# Patient Record
Sex: Male | Born: 1965
Health system: Southern US, Community
[De-identification: ages and names within clinical notes are randomized; demographics above are authoritative.]

## PROBLEM LIST (undated history)

## (undated) DIAGNOSIS — S21339A Puncture wound without foreign body of unspecified front wall of thorax with penetration into thoracic cavity, initial encounter: Secondary | ICD-10-CM

## (undated) DIAGNOSIS — I10 Essential (primary) hypertension: Secondary | ICD-10-CM

## (undated) DIAGNOSIS — E785 Hyperlipidemia, unspecified: Secondary | ICD-10-CM

## (undated) DIAGNOSIS — W3400XA Accidental discharge from unspecified firearms or gun, initial encounter: Secondary | ICD-10-CM

## (undated) HISTORY — DX: Essential (primary) hypertension: I10

## (undated) HISTORY — PX: CHOLECYSTECTOMY: SHX55

## (undated) HISTORY — PX: OTHER SURGICAL HISTORY: SHX169

## (undated) HISTORY — DX: Hyperlipidemia, unspecified: E78.5

---

## 1999-05-01 ENCOUNTER — Emergency Department (HOSPITAL_COMMUNITY): Admission: EM | Admit: 1999-05-01 | Discharge: 1999-05-01 | Payer: Self-pay | Admitting: Emergency Medicine

## 2000-05-09 ENCOUNTER — Emergency Department (HOSPITAL_COMMUNITY): Admission: EM | Admit: 2000-05-09 | Discharge: 2000-05-09 | Payer: Self-pay | Admitting: Internal Medicine

## 2000-06-02 ENCOUNTER — Encounter: Payer: Self-pay | Admitting: Surgery

## 2000-06-04 ENCOUNTER — Observation Stay (HOSPITAL_COMMUNITY): Admission: RE | Admit: 2000-06-04 | Discharge: 2000-06-05 | Payer: Self-pay | Admitting: Surgery

## 2000-06-04 ENCOUNTER — Encounter (INDEPENDENT_AMBULATORY_CARE_PROVIDER_SITE_OTHER): Payer: Self-pay | Admitting: Specialist

## 2000-08-28 ENCOUNTER — Encounter: Payer: Self-pay | Admitting: Emergency Medicine

## 2000-08-28 ENCOUNTER — Emergency Department (HOSPITAL_COMMUNITY): Admission: EM | Admit: 2000-08-28 | Discharge: 2000-08-28 | Payer: Self-pay | Admitting: Emergency Medicine

## 2000-09-04 ENCOUNTER — Ambulatory Visit (HOSPITAL_COMMUNITY): Admission: RE | Admit: 2000-09-04 | Discharge: 2000-09-05 | Payer: Self-pay | Admitting: Surgery

## 2002-08-19 ENCOUNTER — Inpatient Hospital Stay (HOSPITAL_COMMUNITY): Admission: EM | Admit: 2002-08-19 | Discharge: 2002-08-21 | Payer: Self-pay | Admitting: Emergency Medicine

## 2002-08-19 ENCOUNTER — Encounter: Payer: Self-pay | Admitting: Emergency Medicine

## 2002-10-29 ENCOUNTER — Emergency Department (HOSPITAL_COMMUNITY): Admission: EM | Admit: 2002-10-29 | Discharge: 2002-10-30 | Payer: Self-pay | Admitting: *Deleted

## 2002-10-29 ENCOUNTER — Encounter: Payer: Self-pay | Admitting: Emergency Medicine

## 2003-01-13 ENCOUNTER — Encounter: Payer: Self-pay | Admitting: Emergency Medicine

## 2003-01-13 ENCOUNTER — Emergency Department (HOSPITAL_COMMUNITY): Admission: EM | Admit: 2003-01-13 | Discharge: 2003-01-13 | Payer: Self-pay | Admitting: Emergency Medicine

## 2004-07-23 ENCOUNTER — Emergency Department (HOSPITAL_COMMUNITY): Admission: EM | Admit: 2004-07-23 | Discharge: 2004-07-23 | Payer: Self-pay | Admitting: *Deleted

## 2006-09-29 ENCOUNTER — Emergency Department (HOSPITAL_COMMUNITY): Admission: EM | Admit: 2006-09-29 | Discharge: 2006-09-29 | Payer: Self-pay | Admitting: Family Medicine

## 2006-12-15 ENCOUNTER — Emergency Department (HOSPITAL_COMMUNITY): Admission: EM | Admit: 2006-12-15 | Discharge: 2006-12-15 | Payer: Self-pay | Admitting: Emergency Medicine

## 2007-08-17 ENCOUNTER — Emergency Department (HOSPITAL_COMMUNITY): Admission: EM | Admit: 2007-08-17 | Discharge: 2007-08-17 | Payer: Self-pay | Admitting: Emergency Medicine
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
## 2010-09-13 NOTE — Op Note (Signed)
[place]  Patient:    Billy Hogan, Billy Hogan                         MRN: [id number] Proc. Date: 09/04/00 Adm. Date:  [id number] Disc. Date: [id number] Attending:  Abigail Miyamoto A                           Operative Report  PREOPERATIVE DIAGNOSIS:  Ventral incisional hernia.  POSTOPERATIVE DIAGNOSIS:  Ventral incisional hernia.  PROCEDURE:  Open ventral hernia repair with mesh.  SURGEON:  Abigail Miyamoto, M.D.  ANESTHESIA:  General endotracheal anesthesia.  ESTIMATED BLOOD LOSS:  Minimal.  DESCRIPTION OF PROCEDURE:  The patient was brought to the operating room and identified as Billy Hogan.  He was placed supine on the operating table and general anesthesia was induced.  His abdomen was the prepped and draped in the usual sterile fashion.  Using a #10 blade, a small midline incision was made in the upper abdomen through the patients previous incision.  The incision was carried down to the hernia sac which is easily identified with the electrocautery.  The hernia sac was then dissected off circumferentially.  The sac was found to contain omental fat with no evidence of incarcerated bowel. The omentum from the hernia was completely resected with the electrocautery. The rest of the contents were then pushed back into the hernia defect.  Two smaller fascial defects were identified just superior and inferior to this larger defect, and were all well connected by opening the fascia.  Adhesions were then taken down circumferentially from the fascial edges with cautery and the Metzenbaum scissors.  The skin was then mobilized circumferentially, freeing the tissue up from the fascia.  The fascial defect was then closed with a running #1 PDS suture.  A piece of atrial mesh was brought on to the field and fashioned to a 6 x 12 piece.  The mesh was then sewn in an onlay fashion to the fascia with interrupted 0 Novofil pop-off sutures.  A separate skin  incision was then created and a 19-French Blake drain was placed into the wound.  The wound was then copiously irrigated with normal saline.  The subcutaneous layer was then closed with interrupted 3-0 Vicryl sutures and the skin was closed with skin staples.  The drain was then placed with a single Novofil suture.  The patient tolerated the procedure well.  All sponge, needle, and instrument counts were correct at the end of the procedure.  The patient was then extubated in the operating room and taken in stable condition to the recovery room. DD:  09/04/00 TD:  09/06/00 Job: [id number]

## 2010-09-13 NOTE — Op Note (Signed)
[facility]  Patient:    Billy Hogan, Billy Hogan                         MRN: [id number] Proc. Date: 06/04/00 Adm. Date:  [id number] Disc. Date: [id number] Attending:  Abigail Miyamoto A                           Operative Report  PREOPERATIVE DIAGNOSIS:  Symptomatic cholelithiasis.  POSTOPERATIVE DIAGNOSIS:  Symptomatic cholelithiasis.  OPERATION:  Laparoscopic cholecystectomy.  SURGEON:  Abigail Miyamoto, M.D.  ASSISTANT:  Chevis Pretty, M.D.  ANESTHESIA:  General endotracheal anesthesia and 0.25% Marcaine plain.  ESTIMATED BLOOD LOSS:   Minimal.  DESCRIPTION OF PROCEDURE:  The patient was brought to the operating room and identified as Billy Hogan.  He was placed supine on the operating room table. General anesthesia was induced.  His abdomen was then prepped and draped in the usual sterile fashion.  Using a 15 blade, a small transverse incision was made through the umbilicus.  Incision was carried down through the fascia. The fascia was then opened with the scalpel.  Hemostat was then used to pass it to the peritoneal cavity.  A finger was then inserted through the incision. The patient was indeed found to have several small fascial defects in the midline incision through a previous laparotomy.  There was no bowel involved with these.  The _____ was then placed through the opening and insufflation of the abdomen was begun.  Next, a 11 mm port was placed in the patients epigastrium, and two 5 mm ports were placed to the patients right flank under direct vision.  The gallbladder was then identified.  It was found to be quite tense and distended.  Therefore, needle aspirator was inserted and bile was aspirated in order to facilitate grasping the gallbladder.  The gallbladder was then grasped and retracted above the liver bed.  Dissection was then carried out at the hilum of the gallbladder.  The cystic duct was identified and clipped three times proximally and  once distally and transected with the scissors.  The cystic artery was then identified and clipped several times proximally and once distally and transected as well.  The gallbladder was then dissected free from the liver bed with electrocautery.  Several holes were made in the thick wall of the gallbladder and some stones spilled out of this and were easily grasped with the stones being prepared.  Once the gallbladder was completed removed, it was placed into and Endosac and removed through the umbilicus.  The abdomen was copiously irrigated with normal saline saline.  Again, hemostasis appeared to be achieved and all stones were removed.  Again, the midline fascia was identified and several small hernia defects were apparent.  At this point, decision was made to remove all ports and deflate the abdomen which was done under direct vision.  The 0 Vicryl at the umbilicus was then tied in place.  A separate #1 Novofil was also placed at the fascial closure.  All skin incisions were then anesthetized with 0.25% Marcaine.  The umbilical incision was then closed with running 4-0 Monocryl. The other incisions were then closed with Dermabond.  The patient tolerated the procedure well.  All sponge, needle and instrument counts were correct at the end of the procedure.  The patient was extubated in the operating room and taken in stable condition to the recovery room. DD:  06/04/00 TD:  06/05/00 Job: [id number]

## 2011-02-13 LAB — POCT URINALYSIS DIP (DEVICE)
Bilirubin Urine: NEGATIVE
Glucose, UA: NEGATIVE
Ketones, ur: NEGATIVE
Nitrite: NEGATIVE
Operator id: 235561
Protein, ur: 30 — AB
Specific Gravity, Urine: 1.01
Urobilinogen, UA: 1
pH: 6

## 2011-02-13 LAB — I-STAT 8, (EC8 V) (CONVERTED LAB)
Acid-Base Excess: 2
BUN: 9
Bicarbonate: 28.9 — ABNORMAL HIGH
Chloride: 101
Glucose, Bld: 103 — ABNORMAL HIGH
HCT: 47
Hemoglobin: 16
Operator id: 235561
Potassium: 3.3 — ABNORMAL LOW
Sodium: 138
TCO2: 30
pCO2, Ven: 51.6 — ABNORMAL HIGH
pH, Ven: 7.356 — ABNORMAL HIGH

## 2011-02-13 LAB — DIFFERENTIAL
Basophils Absolute: 0
Basophils Relative: 0
Eosinophils Absolute: 0
Eosinophils Relative: 0
Lymphocytes Relative: 18
Lymphs Abs: 1
Monocytes Absolute: 0.3
Monocytes Relative: 5
Neutro Abs: 4.2
Neutrophils Relative %: 76

## 2011-02-13 LAB — CBC
HCT: 43.3
Hemoglobin: 14.1
MCHC: 32.5
MCV: 84.3
Platelets: 205
RBC: 5.14
RDW: 14.2 — ABNORMAL HIGH
WBC: 5.5

## 2011-02-13 LAB — POCT I-STAT CREATININE
Creatinine, Ser: 1.4
Operator id: 235561

## 2013-02-22 ENCOUNTER — Encounter (INDEPENDENT_AMBULATORY_CARE_PROVIDER_SITE_OTHER): Payer: Self-pay | Admitting: Surgery

## 2013-03-14 ENCOUNTER — Encounter (INDEPENDENT_AMBULATORY_CARE_PROVIDER_SITE_OTHER): Payer: Self-pay | Admitting: Surgery

## 2013-03-14 ENCOUNTER — Ambulatory Visit (INDEPENDENT_AMBULATORY_CARE_PROVIDER_SITE_OTHER): Payer: 59 | Admitting: Surgery

## 2013-03-14 VITALS — BP 154/70 | HR 88 | Resp 16 | Ht 71.0 in | Wt 298.0 lb

## 2013-03-14 DIAGNOSIS — R229 Localized swelling, mass and lump, unspecified: Secondary | ICD-10-CM

## 2013-03-14 DIAGNOSIS — R222 Localized swelling, mass and lump, trunk: Secondary | ICD-10-CM | POA: Insufficient documentation

## 2013-03-14 NOTE — Progress Notes (Signed)
Patient ID: Billy Hogan, male   DOB: 08-29-1965, 47 y.o.   MRN: [id number]  Chief Complaint  Patient presents with  . New Evaluation    eval rt mid back SUBQ nudule    HPI Billy Hogan is a 47 y.o. male.   HPI This is a pleasant gentleman referred by Dr. Kirby Funk for evaluation of a mass on his back. It has been present for approximately one year it is getting larger and causing him to have pain. He denies any trauma to the area. He is otherwise without complaints. The pain is described as a dull ache. He does not refer anywhere else Past Medical History  Diagnosis Date  . Hyperlipidemia   . Hypertension     Past Surgical History  Procedure Laterality Date  . Cholecystectomy      History reviewed. No pertinent family history.  Social History History  Substance Use Topics  . Smoking status: Former Games developer  . Smokeless tobacco: Never Used  . Alcohol Use: Yes     Comment: occ    No Known Allergies  No current outpatient prescriptions on file.   No current facility-administered medications for this visit.    Review of Systems Review of Systems  Constitutional: Negative for fever, chills and unexpected weight change.  HENT: Negative for congestion, hearing loss, sore throat, trouble swallowing and voice change.   Eyes: Negative for visual disturbance.  Respiratory: Negative for cough and wheezing.   Cardiovascular: Negative for chest pain, palpitations and leg swelling.  Gastrointestinal: Negative for nausea, vomiting, abdominal pain, diarrhea, constipation, blood in stool, abdominal distention, anal bleeding and rectal pain.  Genitourinary: Negative for hematuria and difficulty urinating.  Musculoskeletal: Negative for arthralgias.  Skin: Negative for rash and wound.  Neurological: Negative for seizures, syncope, weakness and headaches.  Hematological: Negative for adenopathy. Does not bruise/bleed easily.  Psychiatric/Behavioral: Negative for confusion.     Blood pressure 154/70, pulse 88, resp. rate 16, height 5\' 11"  (1.803 m), weight 298 lb (135.172 kg).  Physical Exam Physical Exam  Constitutional: He is oriented to person, place, and time. He appears well-developed and well-nourished. No distress.  HENT:  Head: Normocephalic and atraumatic.  Right Ear: External ear normal.  Left Ear: External ear normal.  Nose: Nose normal.  Mouth/Throat: Oropharynx is clear and moist. No oropharyngeal exudate.  Eyes: Conjunctivae are normal. Pupils are equal, round, and reactive to light. Right eye exhibits no discharge. Left eye exhibits no discharge. No scleral icterus.  Neck: Normal range of motion. Neck supple. No tracheal deviation present. No thyromegaly present.  Cardiovascular: Normal rate, regular rhythm, normal heart sounds and intact distal pulses.   No murmur heard. Pulmonary/Chest: Effort normal and breath sounds normal. No respiratory distress. He has no wheezes.  Abdominal: Bowel sounds are normal. He exhibits no distension. There is no tenderness. There is no rebound.  Musculoskeletal: Normal range of motion. He exhibits no edema and no tenderness.  Lymphadenopathy:    He has no cervical adenopathy.  Neurological: He is alert and oriented to person, place, and time.  Skin: Skin is warm and dry. No rash noted. He is not diaphoretic. No erythema.  There is a hard, firm, slightly mobile 3 cm mass on the patient's right paraspinal area at the mid back. It is nontender and there are no skin changes.    Data Reviewed I have reviewed the notes from his primary care physician  Assessment    3 centimeter mass on the back  Plan    Because of his discomfort, increase in size of the mass, and firmness, removal is recommended. It is quite hard and does not  Feel consistent with a lipoma or sebaceous cyst. I believe removal is forwarded for histologic evaluation to rule out malignancy. I discussed the risk of surgery which includes but  is not limited to bleeding, infection, need for further surgery, recurrence, etc. He understands and wishes to proceed.        Shruthi Northrup A 03/14/2013, 3:38 PM

## 2014-04-05 ENCOUNTER — Other Ambulatory Visit: Payer: Self-pay | Admitting: Internal Medicine

## 2014-04-05 DIAGNOSIS — M5416 Radiculopathy, lumbar region: Secondary | ICD-10-CM

## 2014-04-06 ENCOUNTER — Ambulatory Visit: Payer: Self-pay

## 2014-04-06 ENCOUNTER — Encounter: Payer: Self-pay | Admitting: Podiatry

## 2014-04-06 ENCOUNTER — Ambulatory Visit (INDEPENDENT_AMBULATORY_CARE_PROVIDER_SITE_OTHER): Payer: Self-pay | Admitting: Podiatry

## 2014-04-06 VITALS — BP 109/73 | HR 81 | Resp 16

## 2014-04-06 DIAGNOSIS — L600 Ingrowing nail: Secondary | ICD-10-CM

## 2014-04-06 DIAGNOSIS — M2042 Other hammer toe(s) (acquired), left foot: Secondary | ICD-10-CM

## 2014-04-06 MED ORDER — HYDROCODONE-ACETAMINOPHEN 10-325 MG PO TABS: 1.0000 | ORAL_TABLET | Freq: Three times a day (TID) | ORAL | Status: DC | PRN
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
## 2014-04-06 NOTE — Progress Notes (Signed)
Subjective:     Patient ID: Billy Hogan, male   DOB: 10/26/1965, 48 y.o.   MRN: [id number]  HPI patient has inflammation of his forefoot left and also complains of a damaged second nail left it's painful when pressed and very difficult for him to wear shoe gear with or to trim   Review of Systems  All other systems reviewed and are negative.      Objective:   Physical Exam  Constitutional: He is oriented to person, place, and time.  Cardiovascular: Intact distal pulses.   Musculoskeletal: Normal range of motion.  Neurological: He is oriented to person, place, and time.  Skin: Skin is warm.  Nursing note and vitals reviewed.  neurovascular status intact muscle strength adequate with range of motion within normal limits. Patient is noted to have back pain and he is post of an MRI tomorrow and at this time isn't quite a bit of pain. He is noted to have a severely damaged second nail left that's thick and abnormal in appearance and painful when pressed from a dorsal direction and also has mild forefoot discomfort     Assessment:     Damaged second nail left that's painful and has been this way for a number of years and mild forefoot capsulitis    Plan:     H&P and x-rays reviewed. Discussed the nail and I do think it would be best long-term removed and I explained the procedure. Patient wants surgery and I explained risk and today I infiltrated 60 mg Xylocaine Marcaine mixture removed the second nail exposed matrix and applied phenol 5 applications 30 seconds followed by alcohol lavaged and sterile dressing. They've instructions on soaks and reappoint

## 2014-04-06 NOTE — Progress Notes (Signed)
   Subjective:    Patient ID: Billy Hogan, male    DOB: 10/20/1965, 48 y.o.   MRN: [id number]  HPI Comments: "I have so much pain in this toe"  Patient c/o aching 2nd toe left for few months. He dropped a TV down on it months ago. The nail is thick and curling under. Shoes uncomfortable.   Toe Pain       Review of Systems  Musculoskeletal: Positive for back pain and gait problem.  All other systems reviewed and are negative.      Objective:   Physical Exam        Assessment & Plan:

## 2014-04-07 ENCOUNTER — Ambulatory Visit
Admission: RE | Admit: 2014-04-07 | Discharge: 2014-04-07 | Disposition: A | Discharge status: Home / Self Care | Payer: 59 | Source: Ambulatory Visit | Attending: Internal Medicine | Admitting: Internal Medicine

## 2014-04-07 DIAGNOSIS — M5416 Radiculopathy, lumbar region: Secondary | ICD-10-CM

## 2014-04-30 ENCOUNTER — Emergency Department (HOSPITAL_COMMUNITY)
Admission: EM | Admit: 2014-04-30 | Discharge: 2014-04-30 | Disposition: A | Discharge status: Home / Self Care | Payer: 59 | Attending: Emergency Medicine | Admitting: Emergency Medicine

## 2014-04-30 ENCOUNTER — Encounter (HOSPITAL_COMMUNITY): Payer: Self-pay | Admitting: Physical Medicine and Rehabilitation

## 2014-04-30 DIAGNOSIS — Z720 Tobacco use: Secondary | ICD-10-CM | POA: Insufficient documentation

## 2014-04-30 DIAGNOSIS — Z8639 Personal history of other endocrine, nutritional and metabolic disease: Secondary | ICD-10-CM | POA: Diagnosis not present

## 2014-04-30 DIAGNOSIS — H6091 Unspecified otitis externa, right ear: Secondary | ICD-10-CM

## 2014-04-30 DIAGNOSIS — H9201 Otalgia, right ear: Secondary | ICD-10-CM | POA: Diagnosis present

## 2014-04-30 DIAGNOSIS — I10 Essential (primary) hypertension: Secondary | ICD-10-CM | POA: Insufficient documentation

## 2014-04-30 MED ORDER — ANTIPYRINE-BENZOCAINE 5.4-1.4 % OT SOLN: 3.0000 [drp] | OTIC | Status: DC | PRN

## 2014-04-30 MED ORDER — CIPROFLOXACIN-DEXAMETHASONE 0.3-0.1 % OT SUSP
4.0000 [drp] | Freq: Two times a day (BID) | OTIC | Status: DC
  Administered 2014-04-30: 4 [drp] via OTIC
  Filled 2014-04-30: qty 7.5

## 2014-04-30 NOTE — ED Notes (Signed)
Pt presents to department for evaluation of R ear pain. Onset Saturday morning. States he put "sweet oil" inside of ear, no relief. Pt is alert and oriented x4. NAD.

## 2014-04-30 NOTE — ED Notes (Signed)
Pt states R ear pain, onset Saturday. Tenderness noted to R ear, yellow/bloody drainage noted. 8/10 pain at present, states muffled sounds to R ear.

## 2014-04-30 NOTE — ED Provider Notes (Signed)
CSN: [id number]     Arrival date & time 04/30/14  0800 History   First MD Initiated Contact with Patient 04/30/14 0805     Chief Complaint  Patient presents with  . Otalgia     (Consider location/radiation/quality/duration/timing/severity/associated sxs/prior Treatment) HPI   PCP: Lillia Mountain, MD  Darla Lesches is a 49 y.o.male with a significant PMH of hyperlipidemia and hypertension presents to the ER with complaints of RIGHT ear pain. He reports onset started on Saturday. He has been having tenderness and pain to that side. He has also noticed yellow drainage from the ear and a little bit of blood. He describes the pain as 7/10. He denies having fevers, nausea, vomiting, or diarrhea. Denies being exposed to sick contacts or wet for prolonged periods of time. Over-all he is well appearing.   Blood pressure 143/93, pulse 108, temperature 99 F (37.2 C), temperature source Oral, resp. rate 18, height  (1.803 m), weight 310 lb (140.615 kg), SpO2 98 %.  Past Medical History  Diagnosis Date  . Hyperlipidemia   . Hypertension    Past Surgical History  Procedure Laterality Date  . Cholecystectomy     History reviewed. No pertinent family history. History  Substance Use Topics  . Smoking status: Current Every Day Smoker    Types: Cigarettes  . Smokeless tobacco: Never Used  . Alcohol Use: Yes     Comment: occ    Review of Systems  10 Systems reviewed and are negative for acute change except as noted in the HPI.     Allergies  Review of patient's allergies indicates no known allergies.  Home Medications   Prior to Admission medications   Medication Sig Start Date End Date Taking? Authorizing Provider  antipyrine-benzocaine Lyla Son) otic solution Place 3-4 drops into the right ear every 2 (two) hours as needed for ear pain. 04/30/14   Chancey Ringel Irine Seal, PA-C  HYDROcodone-acetaminophen (NORCO) 10-325 MG per tablet Take 1 tablet by mouth every 8 (eight) hours as  needed. 04/06/14   Lenn Sink, DPM  traMADol (ULTRAM) 50 MG tablet Take by mouth every 6 (six) hours as needed.    Historical Provider, MD   BP 138/83 mmHg  Pulse 100  Temp(Src) 99 F (37.2 C) (Oral)  Resp 18  Ht  (1.803 m)  Wt 310 lb (140.615 kg)  BMI 43.26 kg/m2  SpO2 99% Physical Exam  Constitutional: He appears well-developed and well-nourished. No distress.  HENT:  Head: Normocephalic and atraumatic.  Right Ear: There is drainage, swelling and tenderness. Decreased hearing is noted.  Left Ear: Tympanic membrane and ear canal normal.  Nose: Nose normal.  Mouth/Throat: Uvula is midline and oropharynx is clear and moist.  Eyes: Pupils are equal, round, and reactive to light.  Neck: Normal range of motion. Neck supple. No spinous process tenderness and no muscular tenderness present.  Cardiovascular: Normal rate and regular rhythm.   Pulmonary/Chest: Effort normal.  Abdominal: Soft. Bowel sounds are normal. There is no tenderness. There is no rigidity and no guarding.  Neurological: He is alert.  Skin: Skin is warm and dry.  Nursing note and vitals reviewed.   ED Course  Procedures (including critical care time) Labs Review Labs Reviewed - No data to display  Imaging Review No results found.   EKG Interpretation None      MDM   Final diagnoses:  Otitis externa, right    Medications - No data to display antipyrine-benzocaine (AURALGAN) otic solution Place  3-4 drops into the right ear every 2 (two) hours as needed for ear pain. 10 mL Dorthula Matas, PA-C   Pt is well appearing, no systemic symptoms or fevers.  49 y.o.Harle Stanford Zeisler's evaluation in the Emergency Department is complete. It has been determined that no acute conditions requiring further emergency intervention are present at this time. The patient/guardian have been advised of the diagnosis and plan. We have discussed signs and symptoms that warrant return to the ED, such as changes or  worsening in symptoms.  Vital signs are stable at discharge. Filed Vitals:   04/30/14 0901  BP: 138/83  Pulse: 100  Temp:   Resp: 18    Patient/guardian has voiced understanding and agreed to follow-up with the PCP or specialist.      Dorthula Matas, PA-C 04/30/14 1543  Rolland Porter, MD 05/07/14 [id number]

## 2014-04-30 NOTE — Discharge Instructions (Signed)

## 2014-07-10 ENCOUNTER — Emergency Department (HOSPITAL_COMMUNITY)
Admission: EM | Admit: 2014-07-10 | Discharge: 2014-07-11 | Disposition: A | Discharge status: Home / Self Care | Payer: 59 | Attending: Emergency Medicine | Admitting: Emergency Medicine

## 2014-07-10 ENCOUNTER — Encounter (HOSPITAL_COMMUNITY): Payer: Self-pay | Admitting: Emergency Medicine

## 2014-07-10 ENCOUNTER — Emergency Department (HOSPITAL_COMMUNITY): Payer: 59

## 2014-07-10 DIAGNOSIS — E785 Hyperlipidemia, unspecified: Secondary | ICD-10-CM | POA: Insufficient documentation

## 2014-07-10 DIAGNOSIS — Z87828 Personal history of other (healed) physical injury and trauma: Secondary | ICD-10-CM | POA: Insufficient documentation

## 2014-07-10 DIAGNOSIS — I10 Essential (primary) hypertension: Secondary | ICD-10-CM | POA: Diagnosis not present

## 2014-07-10 DIAGNOSIS — Z720 Tobacco use: Secondary | ICD-10-CM | POA: Diagnosis not present

## 2014-07-10 DIAGNOSIS — R079 Chest pain, unspecified: Secondary | ICD-10-CM | POA: Insufficient documentation

## 2014-07-10 HISTORY — DX: Accidental discharge from unspecified firearms or gun, initial encounter: W34.00XA

## 2014-07-10 HISTORY — DX: Puncture wound without foreign body of unspecified front wall of thorax with penetration into thoracic cavity, initial encounter: S21.339A

## 2014-07-10 LAB — BASIC METABOLIC PANEL
Anion gap: 6 (ref 5–15)
BUN: 10 mg/dL (ref 6–23)
CALCIUM: 9.4 mg/dL (ref 8.4–10.5)
CO2: 27 mmol/L (ref 19–32)
Chloride: 107 mmol/L (ref 96–112)
Creatinine, Ser: 1.18 mg/dL (ref 0.50–1.35)
GFR calc Af Amer: 83 mL/min — ABNORMAL LOW (ref >90)
GFR, EST NON AFRICAN AMERICAN: 72 mL/min — AB (ref >90)
Glucose, Bld: 109 mg/dL — ABNORMAL HIGH (ref 70–99)
Potassium: 3.8 mmol/L (ref 3.5–5.1)
SODIUM: 140 mmol/L (ref 135–145)

## 2014-07-10 LAB — CBC
HCT: 45 % (ref 39.0–52.0)
HEMOGLOBIN: 13.8 g/dL (ref 13.0–17.0)
MCH: 27.1 pg (ref 26.0–34.0)
MCHC: 30.7 g/dL (ref 30.0–36.0)
MCV: 88.2 fL (ref 78.0–100.0)
PLATELETS: 223 10*3/uL (ref 150–400)
RBC: 5.1 MIL/uL (ref 4.22–5.81)
RDW: 14 % (ref 11.5–15.5)
WBC: 6.7 10*3/uL (ref 4.0–10.5)

## 2014-07-10 LAB — I-STAT TROPONIN, ED: TROPONIN I, POC: 0 ng/mL (ref 0.00–0.08)

## 2014-07-10 NOTE — ED Notes (Signed)
Pt states that he started having L chest pain yesterday and L arm numbness. No hx of such. Alert and oriented.

## 2014-07-11 ENCOUNTER — Encounter (HOSPITAL_COMMUNITY): Payer: Self-pay

## 2014-07-11 ENCOUNTER — Emergency Department (HOSPITAL_COMMUNITY): Payer: 59

## 2014-07-11 LAB — I-STAT TROPONIN, ED: TROPONIN I, POC: 0 ng/mL (ref 0.00–0.08)

## 2014-07-11 MED ORDER — SODIUM CHLORIDE 0.9 % IV BOLUS (SEPSIS)
1000.0000 mL | Freq: Once | INTRAVENOUS | Status: AC
  Administered 2014-07-11: 1000 mL via INTRAVENOUS

## 2014-07-11 MED ORDER — IOHEXOL 350 MG/ML SOLN
100.0000 mL | Freq: Once | INTRAVENOUS | Status: AC | PRN
  Administered 2014-07-11: 100 mL via INTRAVENOUS

## 2014-07-11 MED ORDER — NITROGLYCERIN 0.4 MG SL SUBL
0.4000 mg | SUBLINGUAL_TABLET | SUBLINGUAL | Status: DC | PRN
  Administered 2014-07-11 (×2): 0.4 mg via SUBLINGUAL
  Filled 2014-07-11 (×2): qty 1
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
## 2014-07-11 NOTE — Discharge Instructions (Signed)
Chest Pain (Nonspecific) Mr. Billy Hogan, your CT scan did not show any tear in your arteries coming from your heart.  Your EKG and blood work were normal. Your chest pain may still be due to injury to your heart, you need to follow-up with your primary care physician within 3 days for continued management. Please also follow up for your high blood pressure.  If symptoms worsen come back to the emergency department immediately. Thank you. It is often hard to give a diagnosis for the cause of chest pain. There is always a chance that your pain could be related to something serious, such as a heart attack or a blood clot in the lungs. You need to follow up with your doctor. HOME CARE  If antibiotic medicine was given, take it as directed by your doctor. Finish the medicine even if you start to feel better.  For the next few days, avoid activities that bring on chest pain. Continue physical activities as told by your doctor.  Do not use any tobacco products. This includes cigarettes, chewing tobacco, and e-cigarettes.  Avoid drinking alcohol.  Only take medicine as told by your doctor.  Follow your doctor's suggestions for more testing if your chest pain does not go away.  Keep all doctor visits you made. GET HELP IF:  Your chest pain does not go away, even after treatment.  You have a rash with blisters on your chest.  You have a fever. GET HELP RIGHT AWAY IF:   You have more pain or pain that spreads to your arm, neck, jaw, back, or belly (abdomen).  You have shortness of breath.  You cough more than usual or cough up blood.  You have very bad back or belly pain.  You feel sick to your stomach (nauseous) or throw up (vomit).  You have very bad weakness.  You pass out (faint).  You have chills. This is an emergency. Do not wait to see if the problems will go away. Call your local emergency services (911 in [place] Do not drive yourself to the hospital. MAKE SURE YOU:   Understand  these instructions.  Will watch your condition.  Will get help right away if you are not doing well or get worse. Document Released: 10/01/2007 Document Revised: 04/19/2013 Document Reviewed: 10/01/2007 [facility] Patient Information 2015 [place], [place]. This information is not intended to replace advice given to you by your health care provider. Make sure you discuss any questions you have with your health care provider.

## 2016-01-26 ENCOUNTER — Other Ambulatory Visit: Payer: Self-pay | Admitting: Internal Medicine

## 2016-01-26 DIAGNOSIS — M5416 Radiculopathy, lumbar region: Secondary | ICD-10-CM

## 2016-02-02 ENCOUNTER — Ambulatory Visit
Admission: RE | Admit: 2016-02-02 | Discharge: 2016-02-02 | Disposition: A | Discharge status: Home / Self Care | Payer: 59 | Source: Ambulatory Visit | Attending: Internal Medicine | Admitting: Internal Medicine

## 2016-02-02 DIAGNOSIS — M5416 Radiculopathy, lumbar region: Secondary | ICD-10-CM

## 2017-12-20 ENCOUNTER — Other Ambulatory Visit: Payer: Self-pay

## 2017-12-20 ENCOUNTER — Ambulatory Visit (HOSPITAL_COMMUNITY)
Admission: EM | Admit: 2017-12-20 | Discharge: 2017-12-20 | Disposition: A | Discharge status: Home / Self Care | Payer: 59 | Attending: Internal Medicine | Admitting: Internal Medicine

## 2017-12-20 ENCOUNTER — Encounter (HOSPITAL_COMMUNITY): Payer: Self-pay

## 2017-12-20 DIAGNOSIS — G44209 Tension-type headache, unspecified, not intractable: Secondary | ICD-10-CM

## 2017-12-20 DIAGNOSIS — R42 Dizziness and giddiness: Secondary | ICD-10-CM

## 2017-12-20 MED ORDER — KETOROLAC TROMETHAMINE 60 MG/2ML IM SOLN
INTRAMUSCULAR | Status: AC
  Filled 2017-12-20: qty 2

## 2017-12-20 MED ORDER — KETOROLAC TROMETHAMINE 60 MG/2ML IM SOLN
60.0000 mg | Freq: Once | INTRAMUSCULAR | Status: AC
  Administered 2017-12-20: 60 mg via INTRAMUSCULAR

## 2017-12-20 MED ORDER — DEXAMETHASONE SODIUM PHOSPHATE 10 MG/ML IJ SOLN
INTRAMUSCULAR | Status: AC
Start: 2017-12-20 — End: ?
  Filled 2017-12-20: qty 1

## 2017-12-20 MED ORDER — NAPROXEN 500 MG PO TABS: 500.0000 mg | ORAL_TABLET | Freq: Two times a day (BID) | ORAL | 0 refills | Status: DC

## 2017-12-20 MED ORDER — DEXAMETHASONE SODIUM PHOSPHATE 10 MG/ML IJ SOLN
10.0000 mg | Freq: Once | INTRAMUSCULAR | Status: AC
  Administered 2017-12-20: 10 mg via INTRAMUSCULAR

## 2017-12-20 NOTE — ED Notes (Signed)
Patient complains of intermittent dizziness.  Patient does have a pcp.
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
## 2017-12-20 NOTE — ED Triage Notes (Signed)
Pt has a headache. Pt b/p was high at church today 190/119.

## 2017-12-20 NOTE — Discharge Instructions (Signed)
We gave you a shot of Toradol and Decadron to help with your headache today.  This should kick in about 30 to 40 minutes. Please continue to use Aleve or Naprosyn for further management of headache at home  Please continue to monitor blood pressure, please keep a log of readings throughout the day, return to your primary care if blood pressure remaining elevated  Please return here go to emergency room if developing worsening headache, worst headache of life, vision changes, chest pain, shortness of breath, worsening dizziness, passing out

## 2019-07-09 ENCOUNTER — Ambulatory Visit: Payer: 59 | Attending: Internal Medicine

## 2019-07-09 DIAGNOSIS — Z23 Encounter for immunization: Secondary | ICD-10-CM

## 2019-07-09 NOTE — Progress Notes (Signed)
   Covid-19 Vaccination Clinic  Name:  Billy Hogan    MRN: [id number] DOB: 1966/02/07  07/09/2019  [name] was observed post Covid-19 immunization for 15 minutes without incident. He was provided with Vaccine Information Sheet and instruction to access the V-Safe system.   [name] was instructed to call 911 with any severe reactions post vaccine: Marland Kitchen Difficulty breathing  . Swelling of face and throat  . A fast heartbeat  . A bad rash all over body  . Dizziness and weakness   Immunizations Administered    Name Date Dose VIS Date Route   Pfizer COVID-19 Vaccine 07/09/2019  8:24 AM 0.3 mL 04/08/2019 Intramuscular   Manufacturer: [employer], [employer]   Lot: [id number]   NDC: [address]-9

## 2019-08-02 ENCOUNTER — Ambulatory Visit: Payer: 59 | Attending: Internal Medicine

## 2019-08-02 DIAGNOSIS — Z23 Encounter for immunization: Secondary | ICD-10-CM

## 2019-08-02 NOTE — Progress Notes (Signed)
   Covid-19 Vaccination Clinic  Name:  CHEVON LAUFER    MRN: [id number] DOB: June 19, 1965  08/02/2019  Mr. Dondero was observed post Covid-19 immunization for 15 minutes without incident. He was provided with Vaccine Information Sheet and instruction to access the V-Safe system.   Mr. Burnside was instructed to call 911 with any severe reactions post vaccine: Marland Kitchen Difficulty breathing  . Swelling of face and throat  . A fast heartbeat  . A bad rash all over body  . Dizziness and weakness   Immunizations Administered    Name Date Dose VIS Date Route   Pfizer COVID-19 Vaccine 08/02/2019  8:59 AM 0.3 mL 04/08/2019 Intramuscular   Manufacturer: [employer], [employer]   Lot: [id number]   NDC: [address]-0

## 2019-11-30 ENCOUNTER — Other Ambulatory Visit: Payer: Self-pay | Admitting: Orthopedic Surgery

## 2019-11-30 DIAGNOSIS — M5416 Radiculopathy, lumbar region: Secondary | ICD-10-CM

## 2019-12-24 ENCOUNTER — Ambulatory Visit
Admission: RE | Admit: 2019-12-24 | Discharge: 2019-12-24 | Disposition: A | Discharge status: Home / Self Care | Payer: 59 | Source: Ambulatory Visit | Attending: Orthopedic Surgery | Admitting: Orthopedic Surgery

## 2019-12-24 DIAGNOSIS — M5416 Radiculopathy, lumbar region: Secondary | ICD-10-CM

## 2021-06-12 ENCOUNTER — Ambulatory Visit: Payer: No Typology Code available for payment source | Admitting: Dietician

## 2021-10-24 ENCOUNTER — Other Ambulatory Visit (HOSPITAL_COMMUNITY): Payer: Self-pay | Admitting: Internal Medicine

## 2021-10-24 DIAGNOSIS — R079 Chest pain, unspecified: Secondary | ICD-10-CM

## 2021-10-31 IMAGING — MR MR LUMBAR SPINE W/O CM
4 of 5 series · 18 of 48 positions shown · non-contrast
Comparison: Lumbar MRI 02/02/2016

CLINICAL DATA: Low back pain right leg pain

EXAM:
MRI LUMBAR SPINE WITHOUT CONTRAST
TECHNIQUE: Multiplanar, multisequence MR imaging of the lumbar spine was
performed. No intravenous contrast was administered.

[Series 5: T2 · sagittal · 4.0mm · 0.73mm/px · 5 of 15 slices shown (1 of 2)]
[im 1/15]
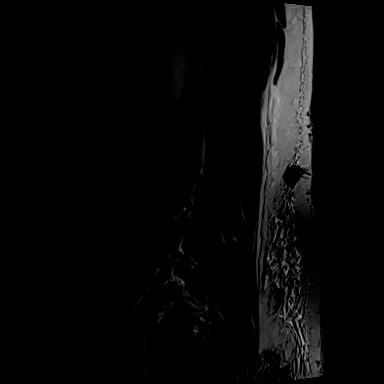
[im 4/15]
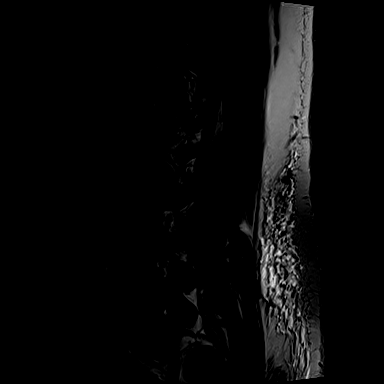
[im 8/15]
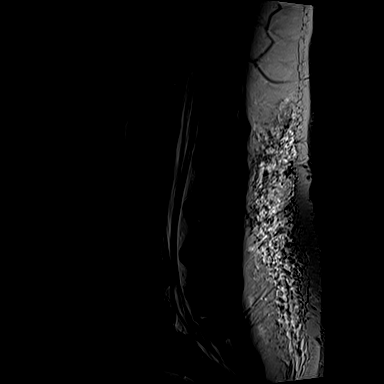
[im 11/15]
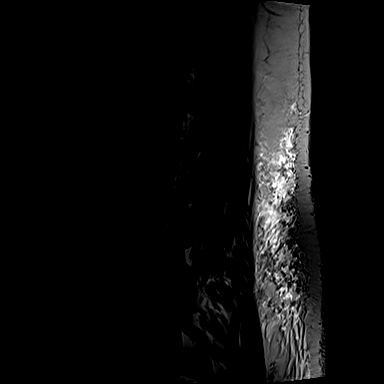
[im 15/15]
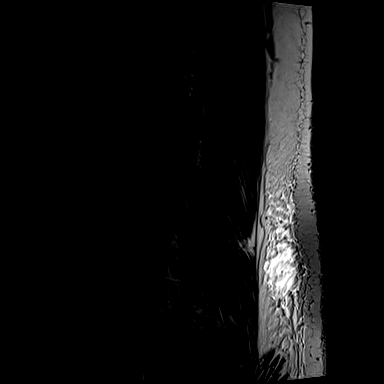

[Series 6: T1 · sagittal · 4.0mm · 0.73mm/px · 3 of 15 slices shown (1 of 2)]
[im 1/15]
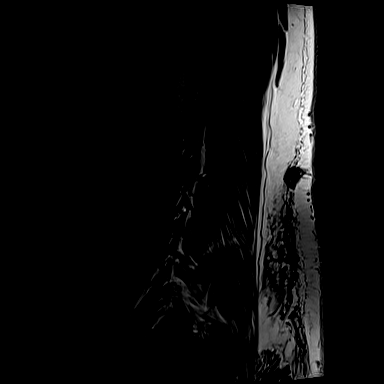
[im 8/15]
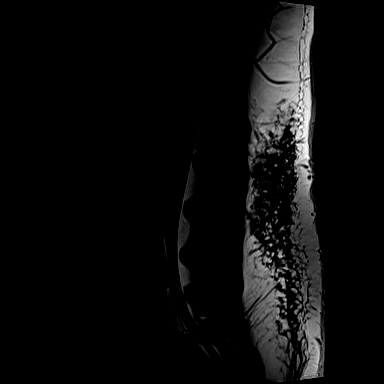
[im 15/15]
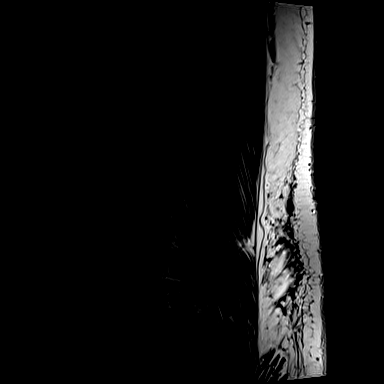

[Series 11: T2 · axial · 4.0mm · 0.28mm/px · z∈[-25,+149]mm · 7 of 42 slices shown (2 of 2)]
[im 3/42]
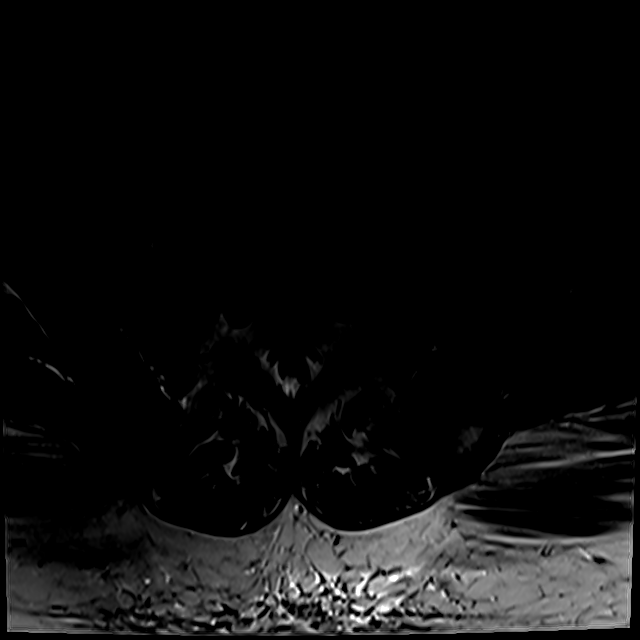
[im 6/42]
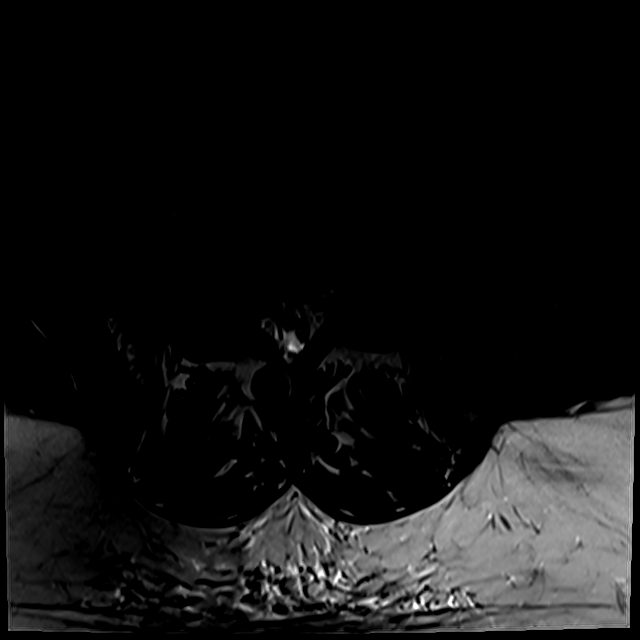
[im 9/42]
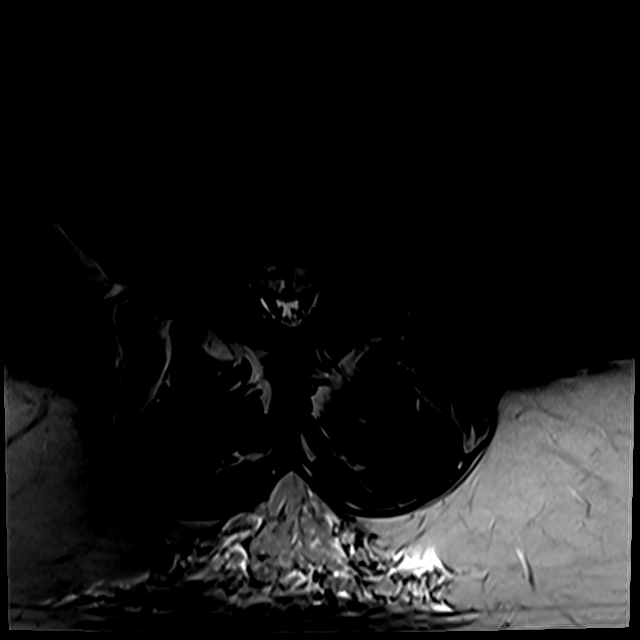
[im 14/42]
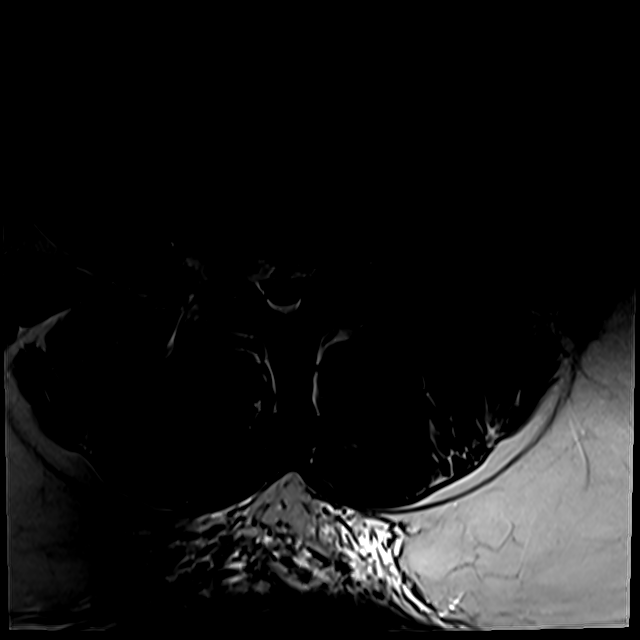
[im 20/42]
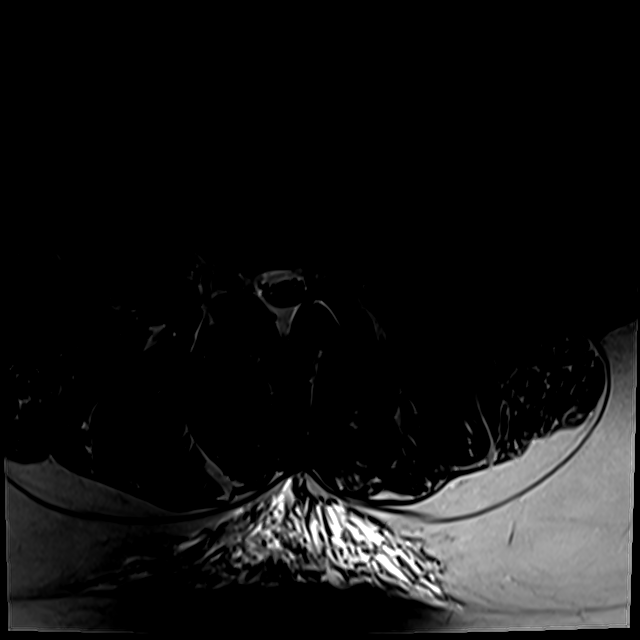
[im 22/42]
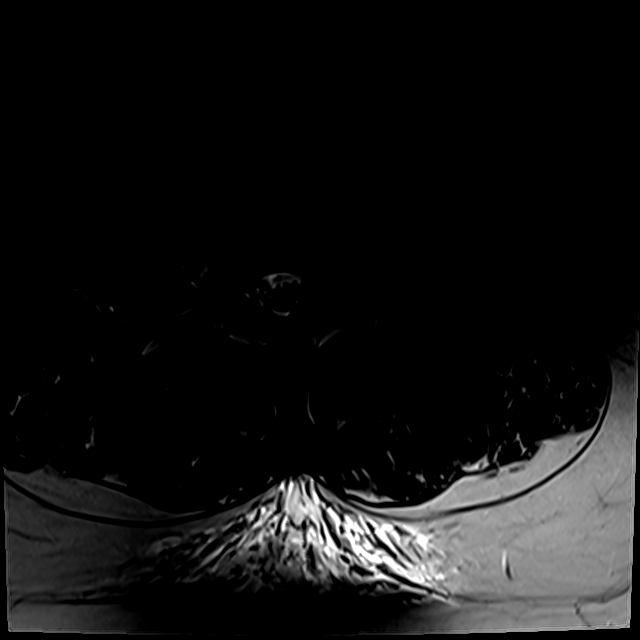
[im 36/42]
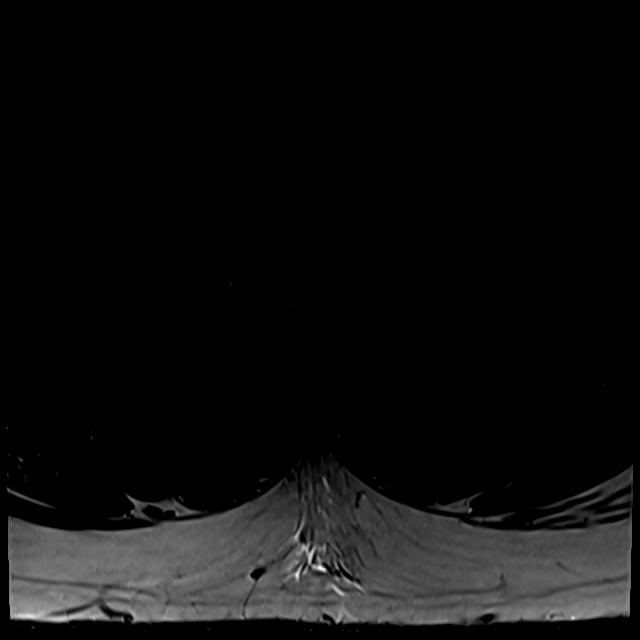

[Series 14: T1 · axial · 4.0mm · 0.28mm/px · z∈[-10,+149]mm · 3 of 42 slices shown (2 of 2)]
[im 6/42]
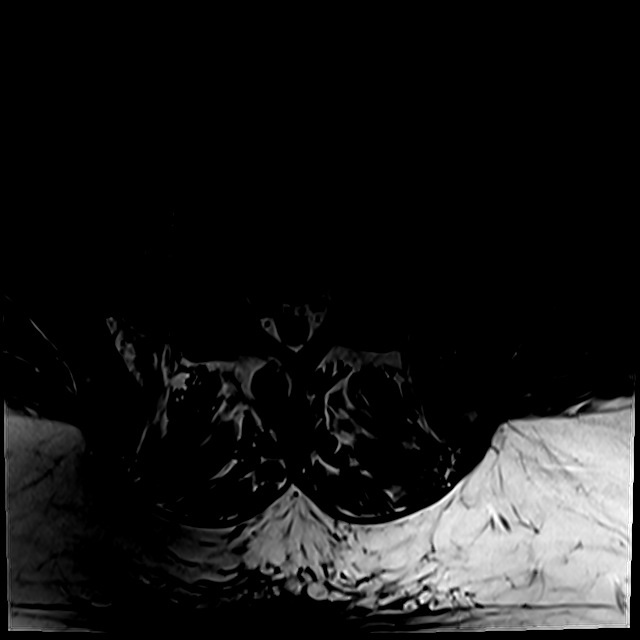
[im 22/42]
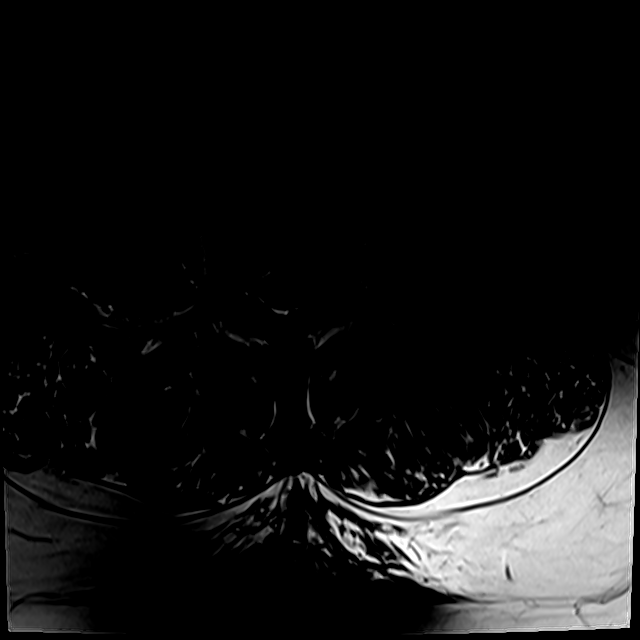
[im 36/42]
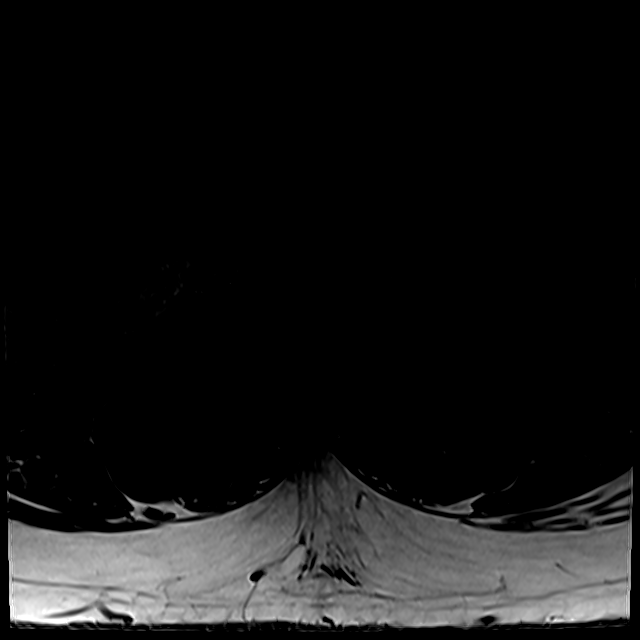

[18 of 48 positions shown; findings below may reference images not displayed]

FINDINGS: Segmentation:  Normal

Alignment:  Normal

Vertebrae:  Normal bone marrow.  Negative for fracture or mass.

Conus medullaris and cauda equina: Conus extends to the L2 level.
Conus and cauda equina appear normal.

Paraspinal and other soft tissues: Negative for paraspinous mass or
adenopathy or fluid collection.

Disc levels:

L1-2: Negative

L2-3: Small right disc protrusion.  Negative for stenosis.

L3-4: Normal disc space. Early facet degeneration. Negative for disc
protrusion or stenosis.

L4-5: Moderately large central disc protrusion with associated
endplate spurring. Moderate facet and ligamentum flavum hypertrophy
contributing to moderate to severe spinal stenosis and moderate
subarticular stenosis bilaterally

L5-S1: Moderately large central disc protrusion. Mild facet
hypertrophy. Moderate subarticular and foraminal stenosis on the
right. Moderate subarticular stenosis on the left.
IMPRESSION: Central disc protrusion L4-5 with moderate to severe spinal stenosis
and moderate subarticular stenosis bilaterally. Progressive disc
protrusion and stenosis since 9663

Central disc protrusion L5-S1 with moderate subarticular foraminal
stenosis on the right and moderate subarticular stenosis on the
left. Progressive disc protrusion and stenosis particular on the
right side compared with the prior study.

## 2022-11-06 ENCOUNTER — Ambulatory Visit: Payer: No Typology Code available for payment source | Admitting: Dermatology

## 2022-12-17 ENCOUNTER — Ambulatory Visit: Payer: No Typology Code available for payment source | Admitting: Dermatology

## 2023-02-02 ENCOUNTER — Ambulatory Visit: Payer: No Typology Code available for payment source | Admitting: Dermatology

## 2023-04-06 ENCOUNTER — Encounter (HOSPITAL_COMMUNITY): Payer: Self-pay

## 2023-04-06 ENCOUNTER — Emergency Department (HOSPITAL_COMMUNITY): Payer: No Typology Code available for payment source

## 2023-04-06 ENCOUNTER — Emergency Department (HOSPITAL_COMMUNITY)
Admission: EM | Admit: 2023-04-06 | Discharge: 2023-04-07 | Disposition: A | Discharge status: Home / Self Care | Payer: No Typology Code available for payment source | Attending: Emergency Medicine | Admitting: Emergency Medicine

## 2023-04-06 ENCOUNTER — Other Ambulatory Visit: Payer: Self-pay

## 2023-04-06 DIAGNOSIS — S39012A Strain of muscle, fascia and tendon of lower back, initial encounter: Secondary | ICD-10-CM | POA: Diagnosis not present

## 2023-04-06 DIAGNOSIS — M25512 Pain in left shoulder: Secondary | ICD-10-CM | POA: Insufficient documentation

## 2023-04-06 DIAGNOSIS — Y92410 Unspecified street and highway as the place of occurrence of the external cause: Secondary | ICD-10-CM | POA: Diagnosis not present

## 2023-04-06 DIAGNOSIS — I484 Atypical atrial flutter: Secondary | ICD-10-CM

## 2023-04-06 DIAGNOSIS — M545 Low back pain, unspecified: Secondary | ICD-10-CM | POA: Diagnosis present

## 2023-04-06 LAB — CBC WITH DIFFERENTIAL/PLATELET
Abs Immature Granulocytes: 0.02 10*3/uL (ref 0.00–0.07)
Basophils Absolute: 0 10*3/uL (ref 0.0–0.1)
Basophils Relative: 0 %
Eosinophils Absolute: 0.1 10*3/uL (ref 0.0–0.5)
Eosinophils Relative: 1 %
HCT: 46 % (ref 39.0–52.0)
Hemoglobin: 14 g/dL (ref 13.0–17.0)
Immature Granulocytes: 0 %
Lymphocytes Relative: 35 %
Lymphs Abs: 3 10*3/uL (ref 0.7–4.0)
MCH: 26.9 pg (ref 26.0–34.0)
MCHC: 30.4 g/dL (ref 30.0–36.0)
MCV: 88.3 fL (ref 80.0–100.0)
Monocytes Absolute: 0.5 10*3/uL (ref 0.1–1.0)
Monocytes Relative: 6 %
Neutro Abs: 4.9 10*3/uL (ref 1.7–7.7)
Neutrophils Relative %: 58 %
Platelets: 223 10*3/uL (ref 150–400)
RBC: 5.21 MIL/uL (ref 4.22–5.81)
RDW: 14.4 % (ref 11.5–15.5)
WBC: 8.6 10*3/uL (ref 4.0–10.5)
nRBC: 0 % (ref 0.0–0.2)

## 2023-04-06 LAB — BRAIN NATRIURETIC PEPTIDE: B Natriuretic Peptide: 55.7 pg/mL (ref 0.0–100.0)

## 2023-04-06 LAB — RAPID URINE DRUG SCREEN, HOSP PERFORMED
Amphetamines: NOT DETECTED
Barbiturates: NOT DETECTED
Benzodiazepines: NOT DETECTED
Cocaine: NOT DETECTED
Opiates: NOT DETECTED
Tetrahydrocannabinol: NOT DETECTED

## 2023-04-06 LAB — BASIC METABOLIC PANEL
Anion gap: 9 (ref 5–15)
BUN: 13 mg/dL (ref 6–20)
CO2: 24 mmol/L (ref 22–32)
Calcium: 9.4 mg/dL (ref 8.9–10.3)
Chloride: 105 mmol/L (ref 98–111)
Creatinine, Ser: 0.92 mg/dL (ref 0.61–1.24)
GFR, Estimated: >60 mL/min (ref >60)
Glucose, Bld: 112 mg/dL — ABNORMAL HIGH (ref 70–99)
Potassium: 4.1 mmol/L (ref 3.5–5.1)
Sodium: 138 mmol/L (ref 135–145)

## 2023-04-06 LAB — TSH: TSH: 1.587 u[IU]/mL (ref 0.350–4.500)

## 2023-04-06 LAB — TROPONIN I (HIGH SENSITIVITY): Troponin I (High Sensitivity): 7 ng/L (ref <18)

## 2023-04-06 MED ORDER — METOPROLOL TARTRATE 25 MG PO TABS
50.0000 mg | ORAL_TABLET | Freq: Once | ORAL | Status: AC
  Administered 2023-04-07: 50 mg via ORAL
  Filled 2023-04-06: qty 2

## 2023-04-06 MED ORDER — LABETALOL HCL 5 MG/ML IV SOLN
5.0000 mg | Freq: Once | INTRAVENOUS | Status: AC
  Administered 2023-04-06: 5 mg via INTRAVENOUS
  Filled 2023-04-06: qty 4

## 2023-04-06 MED ORDER — MORPHINE SULFATE (PF) 4 MG/ML IV SOLN
4.0000 mg | Freq: Once | INTRAVENOUS | Status: AC
  Administered 2023-04-06: 4 mg via INTRAVENOUS
  Filled 2023-04-06: qty 1

## 2023-04-06 MED ORDER — METOPROLOL TARTRATE 25 MG PO TABS: 25.0000 mg | ORAL_TABLET | Freq: Once | ORAL | Status: DC

## 2023-04-06 MED ORDER — SODIUM CHLORIDE 0.9 % IV BOLUS
1000.0000 mL | Freq: Once | INTRAVENOUS | Status: AC
  Administered 2023-04-06: 1000 mL via INTRAVENOUS

## 2023-04-06 MED ORDER — ONDANSETRON HCL 4 MG/2ML IJ SOLN
4.0000 mg | Freq: Once | INTRAMUSCULAR | Status: AC
  Administered 2023-04-06: 4 mg via INTRAVENOUS
  Filled 2023-04-06: qty 2

## 2023-04-06 MED ORDER — LABETALOL HCL 5 MG/ML IV SOLN
5.0000 mg | Freq: Once | INTRAVENOUS | Status: AC
  Administered 2023-04-07: 5 mg via INTRAVENOUS
  Filled 2023-04-06: qty 4

## 2023-04-06 MED ORDER — IOHEXOL 350 MG/ML SOLN
80.0000 mL | Freq: Once | INTRAVENOUS | Status: AC | PRN
  Administered 2023-04-06: 80 mL via INTRAVENOUS

## 2023-04-06 MED ORDER — SODIUM CHLORIDE 0.9 % IV BOLUS
500.0000 mL | Freq: Once | INTRAVENOUS | Status: AC
  Administered 2023-04-06: 500 mL via INTRAVENOUS
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
## 2023-04-06 NOTE — ED Notes (Signed)
Pt to xray via stretcher
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
## 2023-04-07 LAB — TROPONIN I (HIGH SENSITIVITY): Troponin I (High Sensitivity): 6 ng/L (ref <18)

## 2023-04-07 MED ORDER — METOPROLOL SUCCINATE ER 25 MG PO TB24: 50.0000 mg | ORAL_TABLET | Freq: Two times a day (BID) | ORAL | 0 refills | Status: DC

## 2023-04-07 MED ORDER — APIXABAN (ELIQUIS) EDUCATION KIT FOR DVT/PE PATIENTS
PACK | Freq: Once | Status: AC
  Filled 2023-04-07: qty 1

## 2023-04-07 MED ORDER — APIXABAN 5 MG PO TABS: 5.0000 mg | ORAL_TABLET | Freq: Two times a day (BID) | ORAL | 0 refills | Status: DC
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
## 2023-04-07 NOTE — ED Provider Notes (Signed)
Received call from pharmacist questioning the medication as metoprolol succinate 25 mg 2 p.o. twice daily.  He wanted to know if we went to give 25 mg two twice daily or 50 mg once twice daily.  He had question whether or not we wanted the succinate form.  I did review the cardiology and ED notes.  Cardiology advised that the patient should be on metoprolol succinate and advised 50 mg twice daily.  We discussed that he could give either form but he wished for clarification so we changed to the 50 mg one twice daily.   Margarita Grizzle, MD 04/07/23 [phone]
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
## 2023-04-08 ENCOUNTER — Encounter: Payer: Self-pay | Admitting: Cardiovascular Disease

## 2023-04-08 ENCOUNTER — Ambulatory Visit
Payer: No Typology Code available for payment source | Attending: Cardiovascular Disease | Admitting: Cardiovascular Disease

## 2023-04-08 VITALS — BP 120/82 | HR 106 | Ht 71.0 in | Wt 325.2 lb

## 2023-04-08 DIAGNOSIS — I483 Typical atrial flutter: Secondary | ICD-10-CM

## 2023-04-08 MED ORDER — METOPROLOL SUCCINATE ER 25 MG PO TB24: 75.0000 mg | ORAL_TABLET | Freq: Two times a day (BID) | ORAL | 5 refills | Status: DC

## 2023-04-08 MED ORDER — APIXABAN 5 MG PO TABS: 5.0000 mg | ORAL_TABLET | Freq: Two times a day (BID) | ORAL | 0 refills | Status: DC

## 2023-04-08 NOTE — Patient Instructions (Signed)
Medication Instructions:  Your physician has recommended you make the following change in your medication:  1.) increase Toprol XL 25 mg to 75 mg (three tablets) twice a day  *If you need a refill on your cardiac medications before your next appointment, please call your pharmacy*   Lab Work: none If you have labs (blood work) drawn today and your tests are completely normal, you will receive your results only by: MyChart Message (if you have MyChart) OR A paper copy in the mail If you have any lab test that is abnormal or we need to change your treatment, we will call you to review the results.   Testing/Procedures: Your physician has requested that you have an echocardiogram. Echocardiography is a painless test that uses sound waves to create images of your heart. It provides your doctor with information about the size and shape of your heart and how well your heart's chambers and valves are working. This procedure takes approximately one hour. There are no restrictions for this procedure. Please do NOT wear cologne, perfume, aftershave, or lotions (deodorant is allowed). Please arrive 15 minutes prior to your appointment time.  Please note: We ask at that you not bring children with you during ultrasound (echo/ vascular) testing. Due to room size and safety concerns, children are not allowed in the ultrasound rooms during exams. Our front office staff cannot provide observation of children in our lobby area while testing is being conducted. An adult accompanying a patient to their appointment will only be allowed in the ultrasound room at the discretion of the ultrasound technician under special circumstances. We apologize for any inconvenience.   Follow-Up: At [facility], you and your health needs are our priority.  As part of our continuing mission to provide you with exceptional heart care, we have created designated Provider Care Teams.  These Care Teams include your primary  Cardiologist (physician) and Advanced Practice Providers (APPs -  Physician Assistants and Nurse Practitioners) who all work together to provide you with the care you need, when you need it.   Your next appointment:   1 month(s)  Provider:   You will follow up in the Atrial Fibrillation Clinic located at [facility]. Your provider will be: Clint R. Fenton, PA-C or Lake Bells, PA-C
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
## 2023-05-01 ENCOUNTER — Ambulatory Visit: Payer: No Typology Code available for payment source | Admitting: Family Medicine

## 2023-05-06 ENCOUNTER — Ambulatory Visit (HOSPITAL_COMMUNITY)
Admission: RE | Admit: 2023-05-06 | Discharge: 2023-05-06 | Disposition: A | Discharge status: Home / Self Care | Payer: No Typology Code available for payment source | Source: Ambulatory Visit | Attending: Internal Medicine | Admitting: Internal Medicine

## 2023-05-06 VITALS — BP 136/90 | HR 126 | Ht 71.0 in | Wt 320.2 lb

## 2023-05-06 DIAGNOSIS — I4892 Unspecified atrial flutter: Secondary | ICD-10-CM | POA: Diagnosis not present

## 2023-05-06 DIAGNOSIS — I483 Typical atrial flutter: Secondary | ICD-10-CM | POA: Diagnosis not present

## 2023-05-06 DIAGNOSIS — Z7984 Long term (current) use of oral hypoglycemic drugs: Secondary | ICD-10-CM | POA: Insufficient documentation

## 2023-05-06 DIAGNOSIS — I10 Essential (primary) hypertension: Secondary | ICD-10-CM | POA: Diagnosis not present

## 2023-05-06 DIAGNOSIS — I4819 Other persistent atrial fibrillation: Secondary | ICD-10-CM | POA: Insufficient documentation

## 2023-05-06 DIAGNOSIS — E119 Type 2 diabetes mellitus without complications: Secondary | ICD-10-CM | POA: Insufficient documentation

## 2023-05-06 DIAGNOSIS — Z79899 Other long term (current) drug therapy: Secondary | ICD-10-CM | POA: Insufficient documentation

## 2023-05-06 DIAGNOSIS — E785 Hyperlipidemia, unspecified: Secondary | ICD-10-CM | POA: Insufficient documentation

## 2023-05-06 DIAGNOSIS — I4891 Unspecified atrial fibrillation: Secondary | ICD-10-CM

## 2023-05-06 LAB — BASIC METABOLIC PANEL
Anion gap: 8 (ref 5–15)
BUN: 10 mg/dL (ref 6–20)
CO2: 27 mmol/L (ref 22–32)
Calcium: 9.5 mg/dL (ref 8.9–10.3)
Chloride: 104 mmol/L (ref 98–111)
Creatinine, Ser: 1.04 mg/dL (ref 0.61–1.24)
GFR, Estimated: >60 mL/min (ref >60)
Glucose, Bld: 156 mg/dL — ABNORMAL HIGH (ref 70–99)
Potassium: 4.3 mmol/L (ref 3.5–5.1)
Sodium: 139 mmol/L (ref 135–145)

## 2023-05-06 LAB — CBC
HCT: 46.3 % (ref 39.0–52.0)
Hemoglobin: 14.3 g/dL (ref 13.0–17.0)
MCH: 26.7 pg (ref 26.0–34.0)
MCHC: 30.9 g/dL (ref 30.0–36.0)
MCV: 86.4 fL (ref 80.0–100.0)
Platelets: 229 10*3/uL (ref 150–400)
RBC: 5.36 MIL/uL (ref 4.22–5.81)
RDW: 14.3 % (ref 11.5–15.5)
WBC: 5.9 10*3/uL (ref 4.0–10.5)
nRBC: 0 % (ref 0.0–0.2)

## 2023-05-06 MED ORDER — METOPROLOL SUCCINATE ER 50 MG PO TB24: 50.0000 mg | ORAL_TABLET | Freq: Two times a day (BID) | ORAL | 3 refills | Status: DC

## 2023-05-06 MED ORDER — APIXABAN 5 MG PO TABS: 5.0000 mg | ORAL_TABLET | Freq: Two times a day (BID) | ORAL | 3 refills | Status: AC
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
## 2023-05-06 NOTE — Patient Instructions (Signed)
 Increase metoprolol  to 100mg  twice a day until day of cardioversion then reduce to 50mg  twice a day   Cardioversion scheduled for: Wednesday, January 15th   - Arrive at the [facility] and go to admitting at 8:00AM   - Do not eat or drink anything after midnight the night prior to your procedure.   - Take all your morning medication (except diabetic medications) with a sip of water prior to arrival.  - You will not be able to drive home after your procedure.    - Do NOT miss any doses of your blood thinner - if you should miss a dose please notify our office immediately.   - If you feel as if you go back into normal rhythm prior to scheduled cardioversion, please notify our office immediately.   If your procedure is canceled in the cardioversion suite you will be charged a cancellation fee.

## 2023-05-11 ENCOUNTER — Ambulatory Visit (INDEPENDENT_AMBULATORY_CARE_PROVIDER_SITE_OTHER): Payer: No Typology Code available for payment source | Admitting: Family Medicine

## 2023-05-11 VITALS — BP 124/86 | HR 101 | Ht 71.0 in | Wt 321.0 lb

## 2023-05-11 DIAGNOSIS — M48062 Spinal stenosis, lumbar region with neurogenic claudication: Secondary | ICD-10-CM | POA: Diagnosis not present

## 2023-05-11 MED ORDER — GABAPENTIN 300 MG PO CAPS: 300.0000 mg | ORAL_CAPSULE | Freq: Three times a day (TID) | ORAL | 3 refills | Status: DC | PRN
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
## 2023-05-11 NOTE — Patient Instructions (Addendum)
 Thank you for coming in today.   I've referred you to Physical Therapy.  Let us  know if you don't hear from them in one week.   Gabapentin  for nerve pain as needed mostly at bedtime.   Please call [facility] ([facility]) at [phone] to schedule your spine injection.   When cardiology says its ok.

## 2023-05-12 NOTE — Progress Notes (Signed)
Unable to reach patient about procedure, but was able to leave a detailed message. Stated that the patient needed to arrive at the hospital at 0800 , remain NPO after 0000, needs to have a ride home and a responsible adult to stay with them for 24 hours after the procedure. Instructed the patient to call back if they had any questions.

## 2023-05-13 ENCOUNTER — Encounter (HOSPITAL_COMMUNITY)
Admission: RE | Disposition: A | Discharge status: Home / Self Care | Payer: Self-pay | Source: Home / Self Care | Attending: Cardiology

## 2023-05-13 ENCOUNTER — Ambulatory Visit (HOSPITAL_COMMUNITY): Payer: No Typology Code available for payment source | Admitting: Anesthesiology

## 2023-05-13 ENCOUNTER — Telehealth: Payer: Self-pay

## 2023-05-13 ENCOUNTER — Ambulatory Visit (HOSPITAL_COMMUNITY)
Admission: RE | Admit: 2023-05-13 | Discharge: 2023-05-13 | Disposition: A | Discharge status: Home / Self Care | Payer: No Typology Code available for payment source | Attending: Cardiology | Admitting: Cardiology

## 2023-05-13 DIAGNOSIS — Z7901 Long term (current) use of anticoagulants: Secondary | ICD-10-CM | POA: Insufficient documentation

## 2023-05-13 DIAGNOSIS — Z79899 Other long term (current) drug therapy: Secondary | ICD-10-CM | POA: Insufficient documentation

## 2023-05-13 DIAGNOSIS — I10 Essential (primary) hypertension: Secondary | ICD-10-CM | POA: Diagnosis not present

## 2023-05-13 DIAGNOSIS — I4892 Unspecified atrial flutter: Secondary | ICD-10-CM | POA: Diagnosis not present

## 2023-05-13 DIAGNOSIS — E785 Hyperlipidemia, unspecified: Secondary | ICD-10-CM | POA: Insufficient documentation

## 2023-05-13 DIAGNOSIS — Z7984 Long term (current) use of oral hypoglycemic drugs: Secondary | ICD-10-CM | POA: Insufficient documentation

## 2023-05-13 DIAGNOSIS — E119 Type 2 diabetes mellitus without complications: Secondary | ICD-10-CM | POA: Insufficient documentation

## 2023-05-13 DIAGNOSIS — I4819 Other persistent atrial fibrillation: Secondary | ICD-10-CM

## 2023-05-13 DIAGNOSIS — I4891 Unspecified atrial fibrillation: Secondary | ICD-10-CM | POA: Diagnosis not present

## 2023-05-13 DIAGNOSIS — F17200 Nicotine dependence, unspecified, uncomplicated: Secondary | ICD-10-CM | POA: Diagnosis not present

## 2023-05-13 HISTORY — PX: CARDIOVERSION: EP1203

## 2023-05-13 LAB — GLUCOSE, CAPILLARY: Glucose-Capillary: 107 mg/dL — ABNORMAL HIGH (ref 70–99)

## 2023-05-13 SURGERY — CARDIOVERSION (CATH LAB)
Anesthesia: General

## 2023-05-13 MED ORDER — SODIUM CHLORIDE 0.9% FLUSH: 3.0000 mL | INTRAVENOUS | Status: DC | PRN

## 2023-05-13 MED ORDER — PROPOFOL 10 MG/ML IV BOLUS
INTRAVENOUS | Status: DC | PRN
  Administered 2023-05-13: 55 mg via INTRAVENOUS
  Administered 2023-05-13: 65 mg via INTRAVENOUS

## 2023-05-13 MED ORDER — SODIUM CHLORIDE 0.9% FLUSH: 3.0000 mL | Freq: Two times a day (BID) | INTRAVENOUS | Status: DC

## 2023-05-13 MED ORDER — LIDOCAINE 2% (20 MG/ML) 5 ML SYRINGE
INTRAMUSCULAR | Status: DC | PRN
  Administered 2023-05-13: 20 mg via INTRAVENOUS

## 2023-05-13 SURGICAL SUPPLY — 1 items: PAD DEFIB RADIO PHYSIO CONN (PAD) ×2 IMPLANT

## 2023-05-13 NOTE — Anesthesia Postprocedure Evaluation (Signed)
 Anesthesia Post Note  Patient: Billy Hogan  Procedure(s) Performed: CARDIOVERSION     Patient location during evaluation: PACU Anesthesia Type: General Level of consciousness: awake and alert Pain management: pain level controlled Vital Signs Assessment: post-procedure vital signs reviewed and stable Respiratory status: spontaneous breathing, nonlabored ventilation, respiratory function stable and patient connected to nasal cannula oxygen Cardiovascular status: blood pressure returned to baseline and stable Postop Assessment: no apparent nausea or vomiting Anesthetic complications: no  No notable events documented.  Last Vitals:  Vitals:   05/13/23 0906 05/13/23 0930  BP: (!) 124/95 (!) 118/92  Pulse: 75 71  Resp: (!) 21 16  Temp:    SpO2: 96% 98%    Last Pain:  Vitals:   05/13/23 0905  TempSrc: Temporal  PainSc: 0-No pain                 Melvenia Stabs

## 2023-05-13 NOTE — Interval H&P Note (Signed)
 History and Physical Interval Note:  05/13/2023 8:37 AM  Billy Hogan  has presented today for surgery, with the diagnosis of AFIB.  The various methods of treatment have been discussed with the patient and family. After consideration of risks, benefits and other options for treatment, the patient has consented to  Procedure(s): CARDIOVERSION (N/A) as a surgical intervention.  The patient's history has been reviewed, patient examined, no change in status, stable for surgery.  I have reviewed the patient's chart and labs.  Questions were answered to the patient's satisfaction.    Informed Consent   Shared Decision Making/Informed Consent The risks (stroke, cardiac arrhythmias rarely resulting in the need for a temporary or permanent pacemaker, skin irritation or burns and complications associated with conscious sedation including aspiration, arrhythmia, respiratory failure and death), benefits (restoration of normal sinus rhythm) and alternatives of a direct current cardioversion were explained in detail to Mr. Do and he agrees to proceed.       No skipped doses of Eliquis  for the last 4 weeks.  Wife is point of contact.   [name] [name], [facility] 8:37 AM 05/13/23

## 2023-05-13 NOTE — Discharge Instructions (Signed)

## 2023-05-13 NOTE — Transfer of Care (Signed)
 Immediate Anesthesia Transfer of Care Note  Patient: Billy Hogan  Procedure(s) Performed: CARDIOVERSION  Patient Location: PACU  Anesthesia Type:General  Level of Consciousness: awake, alert , and oriented  Airway & Oxygen Therapy: Patient Spontanous Breathing and Patient connected to face mask oxygen  Post-op Assessment: Report given to RN and Post -op Vital signs reviewed and stable  Post vital signs: Reviewed and stable  Last Vitals:  Vitals Value Taken Time  BP    Temp    Pulse    Resp    SpO2      Last Pain:  Vitals:   05/13/23 0757  TempSrc: Temporal  PainSc: 0-No pain         Complications: No notable events documented.

## 2023-05-13 NOTE — Anesthesia Preprocedure Evaluation (Signed)
 Anesthesia Evaluation  Patient identified by MRN, date of birth, ID band Patient awake    Reviewed: Allergy & Precautions, NPO status , Patient's Chart, lab work & pertinent test results  Airway Mallampati: II  TM Distance: >3 FB     Dental  (+) Dental Advisory Given   Pulmonary Current Smoker   breath sounds clear to auscultation       Cardiovascular hypertension, Pt. on medications  Rhythm:Regular Rate:Normal     Neuro/Psych negative neurological ROS     GI/Hepatic negative GI ROS, Neg liver ROS,,,  Endo/Other    Class 3 obesity  Renal/GU negative Renal ROS     Musculoskeletal   Abdominal   Peds  Hematology negative hematology ROS (+)   Anesthesia Other Findings   Reproductive/Obstetrics                             Anesthesia Physical Anesthesia Plan  ASA: 3  Anesthesia Plan: General   Post-op Pain Management:    Induction: Intravenous  PONV Risk Score and Plan: 1 and Treatment may vary due to age or medical condition  Airway Management Planned: Mask and Natural Airway  Additional Equipment:   Intra-op Plan:   Post-operative Plan:   Informed Consent: I have reviewed the patients History and Physical, chart, labs and discussed the procedure including the risks, benefits and alternatives for the proposed anesthesia with the patient or authorized representative who has indicated his/her understanding and acceptance.       Plan Discussed with:   Anesthesia Plan Comments:        Anesthesia Quick Evaluation

## 2023-05-13 NOTE — Telephone Encounter (Signed)
 FMLA form - [employer] number [id number]  Received: 05/13/23 Due: 05/15/23  Return fax [phone]

## 2023-05-13 NOTE — CV Procedure (Addendum)
   DIRECT CURRENT CARDIOVERSION  NAME:  Billy Hogan    MRN: [id number] DOB:  12/15/1965    ADMIT DATE: 05/13/2023  Indication:  Symptomatic Persistent Atrial Flutter  Procedure Note:  The patient signed informed consent.  They have had had therapeutic anticoagulation with Eliquis  greater than 3 weeks.  Anesthesia was administered by Dr. Harwood Lingo.  Adequate airway was maintained throughout and vital followed per protocol.  They were cardioverted x 1 with 200J of biphasic synchronized energy.  They converted to NSR.  There were no apparent complications.  The patient had normal neuro status and respiratory status post procedure with vitals stable as recorded elsewhere.    Follow up:  Continue Metoprolol  and Eliquis  for now. Follow appt w/ Afib Clinic on 05/27/2023 and discuss long term anticoagulation needs and re-evaluate the rhythm.   Olinda Bertrand, DO, [facility]  [facility]  [address] [place], [place] [address] [phone] 9:06 AM  Called his wife's cell phone number and as directed called the patient's cell number. Both went to voicemail.   Kavon Valenza Morenci, DO, [facility] 9:15 AM 05/13/23

## 2023-05-14 ENCOUNTER — Encounter (HOSPITAL_COMMUNITY): Payer: Self-pay | Admitting: Cardiology

## 2023-05-15 NOTE — Telephone Encounter (Signed)
Form completed and placed on Dr. Corey's desk to review and sign.  

## 2023-05-18 ENCOUNTER — Ambulatory Visit (HOSPITAL_COMMUNITY): Payer: No Typology Code available for payment source | Attending: Cardiology

## 2023-05-18 DIAGNOSIS — I483 Typical atrial flutter: Secondary | ICD-10-CM | POA: Insufficient documentation

## 2023-05-18 LAB — ECHOCARDIOGRAM COMPLETE
Area-P 1/2: 4.17 cm2
S' Lateral: 3.5 cm

## 2023-05-18 NOTE — Telephone Encounter (Signed)
[facility] faxed successfully and sent to scan.

## 2023-05-22 ENCOUNTER — Encounter: Payer: Self-pay | Admitting: Physical Therapy

## 2023-05-22 ENCOUNTER — Ambulatory Visit: Payer: No Typology Code available for payment source | Admitting: Physical Therapy

## 2023-05-22 DIAGNOSIS — M5459 Other low back pain: Secondary | ICD-10-CM

## 2023-05-22 DIAGNOSIS — R29898 Other symptoms and signs involving the musculoskeletal system: Secondary | ICD-10-CM
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
## 2023-05-26 ENCOUNTER — Telehealth: Payer: Self-pay

## 2023-05-26 ENCOUNTER — Telehealth: Payer: Self-pay | Admitting: Family Medicine

## 2023-05-26 NOTE — Telephone Encounter (Signed)
[facility] - Attending Physician Statement received by fax.  Claim # [id number]  Placed in Dr Zollie Pee box.

## 2023-05-26 NOTE — Telephone Encounter (Signed)
A user error has taken place: encounter opened in error, closed for administrative reasons.

## 2023-05-26 NOTE — Telephone Encounter (Signed)
Visit note and work note faxed per request from [employer].

## 2023-05-27 ENCOUNTER — Encounter (HOSPITAL_COMMUNITY): Payer: Self-pay | Admitting: Internal Medicine

## 2023-05-27 ENCOUNTER — Ambulatory Visit (HOSPITAL_COMMUNITY)
Admission: RE | Admit: 2023-05-27 | Discharge: 2023-05-27 | Disposition: A | Discharge status: Home / Self Care | Payer: No Typology Code available for payment source | Source: Ambulatory Visit | Attending: Internal Medicine | Admitting: Internal Medicine

## 2023-05-27 VITALS — BP 108/74 | HR 84 | Ht 71.0 in | Wt 325.2 lb

## 2023-05-27 DIAGNOSIS — I4819 Other persistent atrial fibrillation: Secondary | ICD-10-CM | POA: Diagnosis present

## 2023-05-27 DIAGNOSIS — E119 Type 2 diabetes mellitus without complications: Secondary | ICD-10-CM | POA: Diagnosis not present

## 2023-05-27 DIAGNOSIS — E785 Hyperlipidemia, unspecified: Secondary | ICD-10-CM | POA: Diagnosis not present

## 2023-05-27 DIAGNOSIS — Z7984 Long term (current) use of oral hypoglycemic drugs: Secondary | ICD-10-CM | POA: Diagnosis not present

## 2023-05-27 DIAGNOSIS — Z79899 Other long term (current) drug therapy: Secondary | ICD-10-CM | POA: Insufficient documentation

## 2023-05-27 DIAGNOSIS — I10 Essential (primary) hypertension: Secondary | ICD-10-CM | POA: Insufficient documentation

## 2023-05-27 DIAGNOSIS — Z7901 Long term (current) use of anticoagulants: Secondary | ICD-10-CM | POA: Insufficient documentation

## 2023-05-27 DIAGNOSIS — I4892 Unspecified atrial flutter: Secondary | ICD-10-CM | POA: Insufficient documentation

## 2023-05-27 NOTE — Telephone Encounter (Signed)
Form completed, reviewed and signed by Dr. Denyse Amass, and placed at the front desk for faxing/scanning.

## 2023-05-27 NOTE — Telephone Encounter (Signed)
Faxed
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
## 2023-06-02 ENCOUNTER — Telehealth: Payer: Self-pay | Admitting: Family Medicine

## 2023-06-02 NOTE — Telephone Encounter (Signed)
Patient called stating that the Gabapentin 300mg  does not seem to be helping his pain. He asked if Dr Denyse Amass could send in something stronger?  Pharmacy: [facility]      - [address]

## 2023-06-03 NOTE — Telephone Encounter (Signed)
 Called pt and advised per Dr. Alease Hunter. Pt verbalized understanding. Provided number to [facility] to call to schedule back injection.

## 2023-06-03 NOTE — Telephone Encounter (Signed)
 Okay to increase gabapentin  to 600 mg which would be 2 pills. Have you scheduled your back injection yet?

## 2023-06-05 ENCOUNTER — Encounter: Payer: Self-pay | Admitting: Physical Therapy

## 2023-06-05 ENCOUNTER — Ambulatory Visit (INDEPENDENT_AMBULATORY_CARE_PROVIDER_SITE_OTHER): Payer: No Typology Code available for payment source | Admitting: Physical Therapy

## 2023-06-05 ENCOUNTER — Other Ambulatory Visit (HOSPITAL_COMMUNITY): Payer: Self-pay

## 2023-06-05 DIAGNOSIS — R29898 Other symptoms and signs involving the musculoskeletal system: Secondary | ICD-10-CM | POA: Diagnosis not present

## 2023-06-05 DIAGNOSIS — M5459 Other low back pain: Secondary | ICD-10-CM | POA: Diagnosis not present
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
## 2023-06-08 ENCOUNTER — Telehealth: Payer: Self-pay

## 2023-06-08 ENCOUNTER — Other Ambulatory Visit (HOSPITAL_COMMUNITY): Payer: Self-pay

## 2023-06-08 NOTE — Telephone Encounter (Signed)
 Work note provided today extending leave through 06/26/23.   New FMLA form received.

## 2023-06-09 ENCOUNTER — Encounter: Payer: Self-pay | Admitting: Physical Therapy

## 2023-06-09 ENCOUNTER — Ambulatory Visit (INDEPENDENT_AMBULATORY_CARE_PROVIDER_SITE_OTHER): Payer: No Typology Code available for payment source | Admitting: Physical Therapy

## 2023-06-09 DIAGNOSIS — R29898 Other symptoms and signs involving the musculoskeletal system: Secondary | ICD-10-CM

## 2023-06-09 DIAGNOSIS — M5459 Other low back pain: Secondary | ICD-10-CM
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
## 2023-06-10 NOTE — Telephone Encounter (Signed)
Form due 06/15/23

## 2023-06-11 NOTE — Telephone Encounter (Signed)
Form completed and placed on Dr. Zollie Pee desk to review and sign.   Remain out of work until 06/26/23.

## 2023-06-11 NOTE — Telephone Encounter (Signed)
Form successfully faxed to [phone], sent to scan.

## 2023-06-12 ENCOUNTER — Encounter: Payer: Self-pay | Admitting: Physical Therapy

## 2023-06-12 ENCOUNTER — Ambulatory Visit: Payer: No Typology Code available for payment source | Admitting: Physical Therapy

## 2023-06-12 DIAGNOSIS — R29898 Other symptoms and signs involving the musculoskeletal system: Secondary | ICD-10-CM

## 2023-06-12 DIAGNOSIS — M5459 Other low back pain: Secondary | ICD-10-CM
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
## 2023-06-13 ENCOUNTER — Encounter: Payer: Self-pay | Admitting: Family Medicine

## 2023-06-16 ENCOUNTER — Encounter: Payer: Self-pay | Admitting: Rehabilitative and Restorative Service Providers"

## 2023-06-16 ENCOUNTER — Ambulatory Visit (INDEPENDENT_AMBULATORY_CARE_PROVIDER_SITE_OTHER): Payer: No Typology Code available for payment source | Admitting: Rehabilitative and Restorative Service Providers"

## 2023-06-16 DIAGNOSIS — R29898 Other symptoms and signs involving the musculoskeletal system: Secondary | ICD-10-CM | POA: Diagnosis not present

## 2023-06-16 DIAGNOSIS — M5459 Other low back pain: Secondary | ICD-10-CM | POA: Diagnosis not present
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
## 2023-06-18 ENCOUNTER — Other Ambulatory Visit: Payer: No Typology Code available for payment source

## 2023-06-19 NOTE — Discharge Instructions (Signed)

## 2023-06-22 ENCOUNTER — Ambulatory Visit
Admission: RE | Admit: 2023-06-22 | Discharge: 2023-06-22 | Disposition: A | Discharge status: Home / Self Care | Payer: No Typology Code available for payment source | Source: Ambulatory Visit | Attending: Family Medicine | Admitting: Family Medicine

## 2023-06-22 DIAGNOSIS — M48062 Spinal stenosis, lumbar region with neurogenic claudication: Secondary | ICD-10-CM

## 2023-06-22 MED ORDER — IOPAMIDOL (ISOVUE-M 200) INJECTION 41%
1.0000 mL | Freq: Once | INTRAMUSCULAR | Status: AC
  Administered 2023-06-22: 1 mL via EPIDURAL

## 2023-06-22 MED ORDER — METHYLPREDNISOLONE ACETATE 40 MG/ML INJ SUSP (RADIOLOG
80.0000 mg | Freq: Once | INTRAMUSCULAR | Status: AC
  Administered 2023-06-22: 80 mg via EPIDURAL

## 2023-06-23 ENCOUNTER — Encounter: Payer: No Typology Code available for payment source | Admitting: Physical Therapy

## 2023-06-25 ENCOUNTER — Telehealth: Payer: Self-pay | Admitting: Family Medicine

## 2023-06-25 ENCOUNTER — Telehealth: Payer: Self-pay

## 2023-06-25 NOTE — Telephone Encounter (Signed)
 Appointment scheduled.

## 2023-06-25 NOTE — Telephone Encounter (Signed)
 Let's get him scheduled for OV with Dr. Denyse Amass for re-eval to return to work.

## 2023-06-25 NOTE — Telephone Encounter (Signed)
 Received 06/25/23 Due 06/29/23
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
## 2023-06-26 ENCOUNTER — Encounter: Payer: Self-pay | Admitting: Physical Therapy

## 2023-06-26 ENCOUNTER — Ambulatory Visit: Payer: No Typology Code available for payment source | Admitting: Physical Therapy

## 2023-06-26 DIAGNOSIS — M5459 Other low back pain: Secondary | ICD-10-CM | POA: Diagnosis not present

## 2023-06-26 DIAGNOSIS — R29898 Other symptoms and signs involving the musculoskeletal system: Secondary | ICD-10-CM

## 2023-06-26 NOTE — Progress Notes (Signed)
   Rubin Payor, PhD, LAT, ATC acting as a scribe for Clementeen Graham, MD.  Billy Hogan is a 58 y.o. male who presents to [facility] at [facility] today for f/u lumbar spinal stenosis and return to work clearance. Pt was last seen by Dr. Denyse Amass on 05/11/23 and was referred to PT, completing 5 visit. Lumbar ESI was also ordered, done on 06/22/23.  Today, pt reports LBP is not as severe. He notes cont'd R shoulder and neck pain. He reports having more PT visits.   Dx imaging: 05/07/23 L-spine MRI 04/06/23 L-spine & T-spine XR   Pertinent review of systems: No fevers or chills  Relevant historical information: Atrial flutter now cardioverted back to sinus rhythm.   Exam:  BP 120/88   Pulse 79   Ht 5\' 11"  (1.803 m)   Wt (!) 335 lb (152 kg)   SpO2 99%   BMI 46.72 kg/m  General: Well Developed, well nourished, and in no acute distress.   MSK: L-spine normal motion      Assessment and Plan: 58 y.o. male with low back pain with spinal stenosis.  Improved after epidural steroid injection.  Still having some back pain and upper thoracic/trapezius pain.  He has 2 more PT sessions left this week.  Plan to return to work on Monday, March 10.  Okay to authorize more PT if needed and will provide time out of work to attend physical therapy if needed.  Recheck back as needed.  Okay to repeat back injection if needed.   PDMP not reviewed this encounter. No orders of the defined types were placed in this encounter.  No orders of the defined types were placed in this encounter.    Discussed warning signs or symptoms. Please see discharge instructions. Patient expresses understanding.   The above documentation has been reviewed and is accurate and complete Clementeen Graham, M.D.
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
## 2023-06-29 ENCOUNTER — Ambulatory Visit (INDEPENDENT_AMBULATORY_CARE_PROVIDER_SITE_OTHER): Payer: No Typology Code available for payment source | Admitting: Family Medicine

## 2023-06-29 VITALS — BP 120/88 | HR 79 | Ht 71.0 in | Wt 335.0 lb

## 2023-06-29 DIAGNOSIS — M48061 Spinal stenosis, lumbar region without neurogenic claudication: Secondary | ICD-10-CM | POA: Diagnosis not present

## 2023-06-29 NOTE — Telephone Encounter (Signed)
 Form completed and placed on Dr. Zollie Pee desk to review and sign.   RTW 07/06/23 with intermittent leave for appointments.

## 2023-06-29 NOTE — Patient Instructions (Addendum)
 Thank you for coming in today.   Finish out PT.  If you need me to extend it let me know.   If you feel like your heart rate is out of control again your PCP or Cardiologist can do a EKG.  Let me know if you need anything.

## 2023-06-30 NOTE — Telephone Encounter (Signed)
 Form reviewed and signed by Dr. Denyse Amass and placed at the front desk for faxing/scanning.

## 2023-07-01 ENCOUNTER — Ambulatory Visit (INDEPENDENT_AMBULATORY_CARE_PROVIDER_SITE_OTHER): Payer: No Typology Code available for payment source | Admitting: Physical Therapy

## 2023-07-01 ENCOUNTER — Encounter: Payer: Self-pay | Admitting: Physical Therapy

## 2023-07-01 DIAGNOSIS — R29898 Other symptoms and signs involving the musculoskeletal system: Secondary | ICD-10-CM | POA: Diagnosis not present

## 2023-07-01 DIAGNOSIS — M5459 Other low back pain: Secondary | ICD-10-CM | POA: Diagnosis not present
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
## 2023-07-03 ENCOUNTER — Ambulatory Visit: Payer: No Typology Code available for payment source | Admitting: Physical Therapy

## 2023-07-03 ENCOUNTER — Encounter: Payer: Self-pay | Admitting: Physical Therapy

## 2023-07-03 DIAGNOSIS — M5459 Other low back pain: Secondary | ICD-10-CM | POA: Diagnosis not present

## 2023-07-03 DIAGNOSIS — R29898 Other symptoms and signs involving the musculoskeletal system: Secondary | ICD-10-CM | POA: Diagnosis not present
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
## 2023-07-06 ENCOUNTER — Ambulatory Visit: Payer: No Typology Code available for payment source | Admitting: Dermatology

## 2023-07-07 ENCOUNTER — Encounter (INDEPENDENT_AMBULATORY_CARE_PROVIDER_SITE_OTHER): Payer: Self-pay | Admitting: Internal Medicine

## 2023-07-08 ENCOUNTER — Telehealth: Payer: Self-pay | Admitting: Family Medicine

## 2023-07-08 NOTE — Telephone Encounter (Signed)
 Patient called stating that he went back to work on Monday full duty. He said that the heavy lifting and carrying have caused pain in his back and neck. He would like to know if Dr Denyse Amass could write a note for him to have restrictions?  Please advise.

## 2023-07-08 NOTE — Telephone Encounter (Addendum)
 Called and spoke with patient.   Lifting and carrying restrictions: 15 lb restriction No climbing  Letter created and placed at the front desk for pick up. Pt aware.

## 2023-07-13 ENCOUNTER — Encounter (INDEPENDENT_AMBULATORY_CARE_PROVIDER_SITE_OTHER): Payer: Self-pay

## 2023-08-06 ENCOUNTER — Telehealth: Payer: Self-pay | Admitting: Family Medicine

## 2023-08-06 NOTE — Telephone Encounter (Signed)
 Patient called to state that he went back to work and his back is still hurting. He is asking if he can get a back brace.  Please Advise.

## 2023-08-06 NOTE — Telephone Encounter (Signed)
 Forwarding to Dr. Denyse Amass to review and advise.

## 2023-08-07 NOTE — Telephone Encounter (Signed)
 OK to try an over the counter back brace but I am not optimistic.  In my opinion there is not really a back brace that will help without being incredibly annoying. I recommend you try to find something at a medical supply store that you can live with and give it a try.

## 2023-08-07 NOTE — Telephone Encounter (Signed)
 Spoke to patient and advised him of what Dr. Denyse Amass said. Patient stated he just needs it at work. He is not lifting very heavy things but he can feel his back when does have to lift anything. Patient stated that he will give the over-the-counter brace a try.

## 2023-10-22 ENCOUNTER — Telehealth: Payer: Self-pay | Admitting: Family Medicine

## 2023-10-22 DIAGNOSIS — M48061 Spinal stenosis, lumbar region without neurogenic claudication: Secondary | ICD-10-CM

## 2023-10-22 NOTE — Telephone Encounter (Signed)
 Last OV 06/29/23  Last back injection 06/22/23  Order placed.

## 2023-10-22 NOTE — Addendum Note (Signed)
 Addended by: MARDY LEOTIS RAMAN on: 10/22/2023 04:08 PM   Modules accepted: Orders

## 2023-10-22 NOTE — Telephone Encounter (Signed)
 Patient called and states that he thinks it is time for another back injection. He is hurting again. He also stated that the gabapentin  is not doing anything for his pain-even with taking two at a time. Please advise.

## 2023-10-26 NOTE — Telephone Encounter (Signed)
 Called pt and advised that back injection has been ordered and to let us  know if he is not contacted about scheduling.

## 2023-11-16 NOTE — Discharge Instructions (Signed)

## 2023-11-17 ENCOUNTER — Ambulatory Visit
Admission: RE | Admit: 2023-11-17 | Discharge: 2023-11-17 | Disposition: A | Discharge status: Home / Self Care | Source: Ambulatory Visit | Attending: Family Medicine | Admitting: Family Medicine

## 2023-11-17 DIAGNOSIS — M48061 Spinal stenosis, lumbar region without neurogenic claudication: Secondary | ICD-10-CM

## 2023-11-17 MED ORDER — IOPAMIDOL (ISOVUE-M 200) INJECTION 41%
1.0000 mL | Freq: Once | INTRAMUSCULAR | Status: AC
  Administered 2023-11-17: 1 mL via EPIDURAL

## 2023-11-17 MED ORDER — METHYLPREDNISOLONE ACETATE 40 MG/ML INJ SUSP (RADIOLOG
80.0000 mg | Freq: Once | INTRAMUSCULAR | Status: AC
  Administered 2023-11-17: 80 mg via EPIDURAL

## 2023-11-18 ENCOUNTER — Ambulatory Visit: Admitting: Family Medicine

## 2023-12-02 ENCOUNTER — Other Ambulatory Visit: Payer: Self-pay | Admitting: Family Medicine

## 2023-12-03 NOTE — Telephone Encounter (Signed)
 Rx refill request approved per Dr. Zollie Pee orders.

## 2023-12-15 ENCOUNTER — Other Ambulatory Visit: Payer: Self-pay | Admitting: Cardiovascular Disease

## 2023-12-18 MED ORDER — METOPROLOL SUCCINATE ER 50 MG PO TB24: 50.0000 mg | ORAL_TABLET | Freq: Two times a day (BID) | ORAL | 3 refills | Status: AC

## 2024-03-02 ENCOUNTER — Telehealth: Payer: Self-pay | Admitting: Family Medicine

## 2024-03-02 NOTE — Telephone Encounter (Signed)
 Called pt confirm best way to get him the letter. He stated he would swing by the office by 4:30 today to pick it up. Letter given to the front office staff

## 2024-03-02 NOTE — Telephone Encounter (Signed)
 Patient called to say he has been doing light duty at work and is doing better and asked to get a note to go back to regular work duties. Please advise.
